# Patient Record
Sex: Female | Born: 1970 | Race: Black or African American | Hispanic: No | Marital: Single | Smoking: Never smoker
Health system: Southern US, Community
[De-identification: ages and names within clinical notes are randomized; demographics above are authoritative.]

## PROBLEM LIST (undated history)

## (undated) DIAGNOSIS — E119 Type 2 diabetes mellitus without complications: Secondary | ICD-10-CM

## (undated) DIAGNOSIS — M329 Systemic lupus erythematosus, unspecified: Secondary | ICD-10-CM

## (undated) DIAGNOSIS — M459 Ankylosing spondylitis of unspecified sites in spine: Secondary | ICD-10-CM

## (undated) DIAGNOSIS — T8859XA Other complications of anesthesia, initial encounter: Secondary | ICD-10-CM

## (undated) DIAGNOSIS — T4145XA Adverse effect of unspecified anesthetic, initial encounter: Secondary | ICD-10-CM

## (undated) DIAGNOSIS — M352 Behcet's disease: Secondary | ICD-10-CM

## (undated) DIAGNOSIS — L0231 Cutaneous abscess of buttock: Secondary | ICD-10-CM

## (undated) DIAGNOSIS — K219 Gastro-esophageal reflux disease without esophagitis: Secondary | ICD-10-CM

## (undated) DIAGNOSIS — K5090 Crohn's disease, unspecified, without complications: Secondary | ICD-10-CM

## (undated) DIAGNOSIS — L03317 Cellulitis of buttock: Secondary | ICD-10-CM

## (undated) DIAGNOSIS — I341 Nonrheumatic mitral (valve) prolapse: Secondary | ICD-10-CM

## (undated) DIAGNOSIS — K603 Anal fistula: Secondary | ICD-10-CM

## (undated) DIAGNOSIS — K529 Noninfective gastroenteritis and colitis, unspecified: Secondary | ICD-10-CM

## (undated) HISTORY — DX: Noninfective gastroenteritis and colitis, unspecified: K52.9

## (undated) HISTORY — PX: SHOULDER SURGERY: SHX246

## (undated) HISTORY — PX: CHOLECYSTECTOMY: SHX55

## (undated) HISTORY — PX: KNEE SURGERY: SHX244

## (undated) HISTORY — PX: ABDOMINAL HYSTERECTOMY: SHX81

## (undated) HISTORY — DX: Anal fistula: K60.3

## (undated) HISTORY — PX: BREAST LUMPECTOMY: SHX2

## (undated) HISTORY — DX: Gastro-esophageal reflux disease without esophagitis: K21.9

## (undated) HISTORY — PX: WRIST SURGERY: SHX841

---

## 2014-01-05 DIAGNOSIS — L0231 Cutaneous abscess of buttock: Secondary | ICD-10-CM

## 2014-01-05 DIAGNOSIS — L03317 Cellulitis of buttock: Secondary | ICD-10-CM

## 2014-01-05 HISTORY — DX: Cellulitis of buttock: L03.317

## 2014-01-05 HISTORY — DX: Cutaneous abscess of buttock: L02.31

## 2014-01-19 ENCOUNTER — Emergency Department (HOSPITAL_COMMUNITY)
Admission: EM | Admit: 2014-01-19 | Discharge: 2014-01-19 | Discharge status: Against Medical Advice | Payer: Medicare HMO | Source: Home / Self Care | Attending: Emergency Medicine | Admitting: Emergency Medicine

## 2014-01-19 ENCOUNTER — Emergency Department (HOSPITAL_COMMUNITY): Payer: Medicare HMO

## 2014-01-19 ENCOUNTER — Encounter (HOSPITAL_COMMUNITY): Payer: Self-pay | Admitting: Emergency Medicine

## 2014-01-19 DIAGNOSIS — L0390 Cellulitis, unspecified: Secondary | ICD-10-CM

## 2014-01-19 DIAGNOSIS — R000 Tachycardia, unspecified: Secondary | ICD-10-CM

## 2014-01-19 HISTORY — DX: Systemic lupus erythematosus, unspecified: M32.9

## 2014-01-19 HISTORY — DX: Behcet's disease: M35.2

## 2014-01-19 HISTORY — DX: Crohn's disease, unspecified, without complications: K50.90

## 2014-01-19 HISTORY — DX: Ankylosing spondylitis of unspecified sites in spine: M45.9

## 2014-01-19 HISTORY — DX: Type 2 diabetes mellitus without complications: E11.9

## 2014-01-19 LAB — CBC WITH DIFFERENTIAL/PLATELET
BASOS PCT: 0 % (ref 0–1)
Basophils Absolute: 0 10*3/uL (ref 0.0–0.1)
EOS ABS: 0.1 10*3/uL (ref 0.0–0.7)
Eosinophils Relative: 1 % (ref 0–5)
HCT: 37.2 % (ref 36.0–46.0)
HEMOGLOBIN: 13.1 g/dL (ref 12.0–15.0)
Lymphocytes Relative: 9 % — ABNORMAL LOW (ref 12–46)
Lymphs Abs: 1 10*3/uL (ref 0.7–4.0)
MCH: 31 pg (ref 26.0–34.0)
MCHC: 35.2 g/dL (ref 30.0–36.0)
MCV: 88.2 fL (ref 78.0–100.0)
MONOS PCT: 9 % (ref 3–12)
Monocytes Absolute: 1.1 10*3/uL — ABNORMAL HIGH (ref 0.1–1.0)
NEUTROS PCT: 81 % — AB (ref 43–77)
Neutro Abs: 9 10*3/uL — ABNORMAL HIGH (ref 1.7–7.7)
Platelets: 232 10*3/uL (ref 150–400)
RBC: 4.22 MIL/uL (ref 3.87–5.11)
RDW: 11.7 % (ref 11.5–15.5)
WBC: 11.2 10*3/uL — ABNORMAL HIGH (ref 4.0–10.5)

## 2014-01-19 LAB — CBG MONITORING, ED: Glucose-Capillary: 219 mg/dL — ABNORMAL HIGH (ref 70–99)

## 2014-01-19 LAB — URINALYSIS, ROUTINE W REFLEX MICROSCOPIC
Bilirubin Urine: NEGATIVE
GLUCOSE, UA: 500 mg/dL — AB
Ketones, ur: NEGATIVE mg/dL
LEUKOCYTES UA: NEGATIVE
Nitrite: NEGATIVE
Protein, ur: NEGATIVE mg/dL
Specific Gravity, Urine: 1.01 (ref 1.005–1.030)
Urobilinogen, UA: 0.2 mg/dL (ref 0.0–1.0)
pH: 6 (ref 5.0–8.0)

## 2014-01-19 LAB — COMPREHENSIVE METABOLIC PANEL
ALBUMIN: 3.7 g/dL (ref 3.5–5.2)
ALK PHOS: 101 U/L (ref 39–117)
ALT: 36 U/L — ABNORMAL HIGH (ref 0–35)
AST: 28 U/L (ref 0–37)
BUN: 11 mg/dL (ref 6–23)
CO2: 25 mEq/L (ref 19–32)
CREATININE: 0.68 mg/dL (ref 0.50–1.10)
Calcium: 9.5 mg/dL (ref 8.4–10.5)
Chloride: 98 mEq/L (ref 96–112)
GFR calc Af Amer: >90 mL/min (ref >90)
GFR calc non Af Amer: >90 mL/min (ref >90)
Glucose, Bld: 226 mg/dL — ABNORMAL HIGH (ref 70–99)
POTASSIUM: 4 meq/L (ref 3.7–5.3)
Sodium: 137 mEq/L (ref 137–147)
Total Bilirubin: 0.4 mg/dL (ref 0.3–1.2)
Total Protein: 8 g/dL (ref 6.0–8.3)

## 2014-01-19 LAB — URINE MICROSCOPIC-ADD ON

## 2014-01-19 LAB — BASIC METABOLIC PANEL
BUN: 7 mg/dL (ref 6–23)
CO2: 20 meq/L (ref 19–32)
CREATININE: 0.6 mg/dL (ref 0.50–1.10)
Calcium: 7.9 mg/dL — ABNORMAL LOW (ref 8.4–10.5)
Chloride: 99 mEq/L (ref 96–112)
GFR calc Af Amer: >90 mL/min (ref >90)
GFR calc non Af Amer: >90 mL/min (ref >90)
GLUCOSE: 169 mg/dL — AB (ref 70–99)
Potassium: 3.7 mEq/L (ref 3.7–5.3)
Sodium: 134 mEq/L — ABNORMAL LOW (ref 137–147)

## 2014-01-19 LAB — PREGNANCY, URINE: PREG TEST UR: NEGATIVE

## 2014-01-19 LAB — POC OCCULT BLOOD, ED: FECAL OCCULT BLD: NEGATIVE

## 2014-01-19 LAB — I-STAT CG4 LACTIC ACID, ED: Lactic Acid, Venous: 1.43 mmol/L (ref 0.5–2.2)

## 2014-01-19 MED ORDER — SODIUM CHLORIDE 0.9 % IV BOLUS (SEPSIS)
1000.0000 mL | Freq: Once | INTRAVENOUS | Status: AC
  Administered 2014-01-19: 1000 mL via INTRAVENOUS

## 2014-01-19 MED ORDER — VANCOMYCIN HCL IN DEXTROSE 1-5 GM/200ML-% IV SOLN
1000.0000 mg | Freq: Once | INTRAVENOUS | Status: AC
  Administered 2014-01-19: 1000 mg via INTRAVENOUS
  Filled 2014-01-19: qty 200

## 2014-01-19 MED ORDER — OXYCODONE-ACETAMINOPHEN 5-325 MG PO TABS
2.0000 | ORAL_TABLET | Freq: Once | ORAL | Status: AC
  Administered 2014-01-19: 2 via ORAL
  Filled 2014-01-19: qty 2

## 2014-01-19 MED ORDER — OXYCODONE-ACETAMINOPHEN 5-325 MG PO TABS: 2.0000 | ORAL_TABLET | ORAL | Status: AC | PRN

## 2014-01-19 MED ORDER — CLINDAMYCIN HCL 300 MG PO CAPS: 300.0000 mg | ORAL_CAPSULE | Freq: Three times a day (TID) | ORAL | Status: AC

## 2014-01-19 MED ORDER — LORAZEPAM 2 MG/ML IJ SOLN: 0.5000 mg | Freq: Once | INTRAMUSCULAR | Status: DC

## 2014-01-19 MED ORDER — DIPHENHYDRAMINE HCL 50 MG/ML IJ SOLN
25.0000 mg | Freq: Once | INTRAMUSCULAR | Status: AC
  Administered 2014-01-19: 25 mg via INTRAVENOUS
  Filled 2014-01-19: qty 1

## 2014-01-19 MED ORDER — IOHEXOL 300 MG/ML  SOLN
100.0000 mL | Freq: Once | INTRAMUSCULAR | Status: AC | PRN
  Administered 2014-01-19: 100 mL via INTRAVENOUS

## 2014-01-19 MED ORDER — CLINDAMYCIN PHOSPHATE 600 MG/50ML IV SOLN
600.0000 mg | Freq: Once | INTRAVENOUS | Status: AC
  Administered 2014-01-19: 600 mg via INTRAVENOUS
  Filled 2014-01-19: qty 50

## 2014-01-19 MED ORDER — KETOROLAC TROMETHAMINE 30 MG/ML IJ SOLN
30.0000 mg | Freq: Once | INTRAMUSCULAR | Status: DC
  Filled 2014-01-19: qty 1

## 2014-01-19 NOTE — ED Notes (Signed)
Pt was told that she had a fistula that needed surgery but pt had recently moved here since April this year

## 2014-01-19 NOTE — Care Management Note (Signed)
ED/CM noted patient did not have health insurance and/or PCP listed in the computer.  Patient was given the [place] resource handout with information on the clinics, food pantries, and the handout for new health insurance sign-up.  Patient expressed appreciation for information received. 

## 2014-01-19 NOTE — ED Notes (Signed)
This nurse witness rectal exam for EDP Rancour

## 2014-01-19 NOTE — ED Notes (Signed)
Pt verbalized understanding to use caution and no driving within 4 hours of taking percocet due to med causes drowsiness and that med causes constipation

## 2014-01-19 NOTE — ED Notes (Signed)
Pt reports abscess area to r buttocks x 2 weeks.  Reports area is the size of an orange.

## 2014-01-19 NOTE — ED Notes (Signed)
Pt informed tech first then RN that she began all of sudden itching and burning to scalp, IV antibiotic VAnco infusing at time and was stopped, EDP made aware and orders to stop IV Vanco

## 2014-01-19 NOTE — ED Notes (Signed)
hemoccult card negative  

## 2014-01-19 NOTE — ED Notes (Signed)
EDP spoke to pt concerning AMA, explained risks and benefits and concerns for pt's condition, pt insisted on leaving, pt signed AMA form and ambulated out

## 2014-01-19 NOTE — ED Notes (Signed)
Pt with abscess to right buttock for 2 days, pt "feels sick" when asked if N/V, unsure of fever

## 2014-01-19 NOTE — ED Provider Notes (Signed)
CSN: [id number]     Arrival date & time 01/19/14  1122 History  This chart was scribed for Glynn Octave, MD by Milly Jakob, ED Scribe. The patient was seen in room APA12/APA12. Patient's care was started at 1:50 PM.  Chief Complaint  Patient presents with  . Abscess    The history is provided by the patient. No language interpreter was used.   HPI Comments: Wendy Golden is a 43 y.o. female with a history of Crones Disease, Lupus, AS, DM, and Bacet's disease who presents to the Emergency Department complaining of a large area of redness, sharp pain, and swelling on her right buttock which has been present for 4 days. Patient states that pain is severe. Yesterday, she began noticing bloody drainage from the area. She states that she had a similar area 1 month ago that was diagnosed as a fistula. She says that she recently moved to [facility] from [facility] and was supposed to see surgeon, but she was unable to do this before relocating. She states that she has had pain while having bowel movements. She had one episode of vomiting yesterday. She denies constipation, blood in stool, chest pain, SOB, or other symptoms at this time. Patient has a surgical history of partial hysterectomy and cholecystectomy. No history of oopherectomy. Patient states she is allergic to morphine and Dilaudid.   Past Medical History  Diagnosis Date  . Lupus   . Behcet's disease   . AS (ankylosing spondylitis)   . Diabetes mellitus without complication   . Crohn's disease    Past Surgical History  Procedure Laterality Date  . Wrist surgery    . Shoulder surgery    . Breast lumpectomy    . Knee surgery    . Abdominal hysterectomy     No family history on file. History  Substance Use Topics  . Smoking status: Never Smoker   . Smokeless tobacco: Not on file  . Alcohol Use: No   OB History   Grav Para Term Preterm Abortions TAB SAB Ect Mult Living                 Review of Systems A complete 10 system review of  systems was obtained and all systems are negative except as noted in the HPI and PMH.    Allergies  Dilaudid; Keflex; Latex; Morphine and related; Other; Tape; and Toradol  Home Medications   Prior to Admission medications   Medication Sig Start Date End Date Taking? Authorizing Provider  ALPRAZolam Prudy Feeler) 1 MG tablet Take 0.5 mg by mouth daily as needed for anxiety.   Yes Historical Provider, MD  Bioflavonoid Products (VITAMIN C) CHEW Chew 1 tablet by mouth daily.   Yes Historical Provider, MD  ibuprofen (ADVIL,MOTRIN) 200 MG tablet Take 400 tablets by mouth daily as needed. 01/03/14  Yes Historical Provider, MD  Multiple Vitamins-Minerals (HAIR/SKIN/NAILS/BIOTIN) TABS Take 1 tablet by mouth daily.   Yes Historical Provider, MD  Multiple Vitamins-Minerals (MULTIVITAMINS) CHEW Chew 1 tablet by mouth daily.   Yes Historical Provider, MD  ranitidine (ZANTAC) 75 MG tablet Take 1 tablet by mouth daily. 01/03/14  Yes Historical Provider, MD  RESTASIS 0.05 % ophthalmic emulsion Place 1 drop into both eyes daily. 01/14/14  Yes Historical Provider, MD  clindamycin (CLEOCIN) 300 MG capsule Take 1 capsule (300 mg total) by mouth 3 (three) times daily. 01/19/14   Glynn Octave, MD  metFORMIN (GLUCOPHAGE) 1000 MG tablet Take 1 tablet by mouth once. 01/16/14  Historical Provider, MD  oxyCODONE-acetaminophen (PERCOCET/ROXICET) 5-325 MG per tablet Take 2 tablets by mouth every 4 (four) hours as needed for severe pain. 01/19/14   Glynn Octave, MD   Triage Vitals: BP 107/78  Pulse 86  Temp(Src) 98.5 F (36.9 C) (Oral)  Resp 12  Ht 5\' 3"  (1.6 m)  Wt 145 lb (65.772 kg)  BMI 25.69 kg/m2  SpO2 100% Physical Exam  Nursing note and vitals reviewed. Constitutional: She is oriented to person, place, and time. She appears well-developed and well-nourished. No distress.  HENT:  Head: Normocephalic and atraumatic.  Mouth/Throat: Oropharynx is clear and moist.  Eyes: Conjunctivae and EOM are normal. Pupils  are equal, round, and reactive to light.  Neck: Normal range of motion. Neck supple. No tracheal deviation present.  Cardiovascular: Normal rate, regular rhythm and normal heart sounds.   No murmur heard. Pulmonary/Chest: Effort normal and breath sounds normal. No respiratory distress. She has no wheezes. She has no rales.  Abdominal: Soft. She exhibits no distension. There is no tenderness. There is no rebound and no guarding.  Genitourinary:  Rectal exam is normal without obvious fistula. Chaperone present.  Musculoskeletal: Normal range of motion.  Neurological: She is alert and oriented to person, place, and time.  Skin: Skin is warm and dry. There is erythema.  8x8 cm area of erythema to right buttock. Tender without fluctuance or induration. Center punctum has a small area of clear drainage. Does not involve anus.   Psychiatric: She has a normal mood and affect. Her behavior is normal.    ED Course  Procedures (including critical care time) DIAGNOSTIC STUDIES: Oxygen Saturation is 100% on room, normal by my interpretation.    COORDINATION OF CARE: 2:04 PM- Performed BS ultrasound that showed cellulitis but no appreciable fluid collection. Discussed treatment plan which includes CT of abdomen, CBC with diff, CMP, UA, CBG with pt at bedside and pt agreed to plan.   EMERGENCY DEPARTMENT US SOFT TISSUE INTERPRETATION "Study: Limited Ultrasound of the noted body part in comments below"  INDICATIONS: Soft tissue infection Multiple views of the body part are obtained with a multi-frequency linear probe  PERFORMED BY:  Myself  IMAGES ARCHIVED?: Yes  SIDE:Right   BODY PART: buttock   FINDINGS: Cellulitis present  LIMITATIONS:  Emergent Procedure  INTERPRETATION:  No appreciable fluid collection, cellulitis present  COMMENT:  Will order CT of abdomen, pelvis for further evaluation   Labs Review Labs Reviewed  CBC WITH DIFFERENTIAL - Abnormal; Notable for the following:     WBC 11.2 (*)    Neutrophils Relative % 81 (*)    Neutro Abs 9.0 (*)    Lymphocytes Relative 9 (*)    Monocytes Absolute 1.1 (*)    All other components within normal limits  COMPREHENSIVE METABOLIC PANEL - Abnormal; Notable for the following:    Glucose, Bld 226 (*)    ALT 36 (*)    All other components within normal limits  URINALYSIS, ROUTINE W REFLEX MICROSCOPIC - Abnormal; Notable for the following:    Glucose, UA 500 (*)    Hgb urine dipstick TRACE (*)    All other components within normal limits  URINE MICROSCOPIC-ADD ON - Abnormal; Notable for the following:    Squamous Epithelial / LPF FEW (*)    All other components within normal limits  BASIC METABOLIC PANEL - Abnormal; Notable for the following:    Sodium 134 (*)    Glucose, Bld 169 (*)    Calcium 7.9 (*)  All other components within normal limits  CBG MONITORING, ED - Abnormal; Notable for the following:    Glucose-Capillary 219 (*)    All other components within normal limits  PREGNANCY, URINE  POC OCCULT BLOOD, ED  I-STAT CG4 LACTIC ACID, ED    Imaging Review Ct Abdomen Pelvis W Contrast  01/19/2014   CLINICAL DATA:  43 year old female with right gluteal swelling and pain.  EXAM: CT ABDOMEN AND PELVIS WITH CONTRAST  TECHNIQUE: Multidetector CT imaging of the abdomen and pelvis was performed using the standard protocol following bolus administration of intravenous contrast.  CONTRAST:  100mL OMNIPAQUE IOHEXOL 300 MG/ML  SOLN  COMPARISON:  None.  FINDINGS: The lung bases are clear.  The liver, spleen, adrenal glands, pancreas and kidneys are unremarkable.  The patient is status post cholecystectomy.  A small amount of free pelvic fluid - likely physiologic.  There is no evidence of biliary dilatation, abdominal aortic aneurysm or enlarged lymph nodes.  The bowel, bladder and appendix are unremarkable. There is no evidence of bowel obstruction, abscess or pneumoperitoneum.  Stranding/ inflammation in the right gluteal  subcutaneous fat with associated skin thickening is noted. There is no evidence focal collection/ abscess.  No acute or suspicious bony abnormalities are identified.  IMPRESSION: Right gluteal subcutaneous inflammation/ cellulitis without abscess.  Small amount of free pelvic fluid which is likely physiologic.  No other significant abnormalities noted.   Electronically Signed   By: Laveda AbbeJeff  Hu M.D.   On: 01/19/2014 16:02     EKG Interpretation   Date/Time:  Monday January 19 2014 20:28:01 EDT Ventricular Rate:  128 PR Interval:    QRS Duration: 75 QT Interval:  317 QTC Calculation: 463 R Axis:   -37 Text Interpretation:  Sinus tachycardia Left axis deviation Probable  anterior infarct, age indeterminate Confirmed by Lilianah Buffin  MD, Shakeem Stern  [phone] on 01/19/2014 8:46:06 PM      MDM   Final diagnoses:  Cellulitis  Sinus tachycardia   Four day history of redness and pain in her right buttock. Denies trauma. No fever. She is a diabetic with history of Crohn's disease. She states 2 months ago she was told she had a fistula near her rectum but [place]. This seems to have improved. Denies any blood in the stools. No abscess seen on bedside US.  CT scan shows right buttock cellulitis without abscess. No fistula. Edges of cellulitis or marked.  Patient given IV vancomycin in the ED. Approximately 50% of the way through the infusion, she developed severe itching of her scalp. Infusion was stopped. She did not have any difficulty breathing, throat tightness or chest pain. Benadryl given. Vancomycin added to allergy list.   Patient remains tachycardic in ED as high as 130. She denies any dizziness, lightheadedness, chest pain or shortness of breath. Her pain is well-controlled. She declines admission to the hospital. She believes her tachycardia is a combination of pain and anxiety.  I explained to her that her tachycardia could portend a serious infection. I recommended admission  for IV antibiotics and observation. She would rather go home and get rechecked in 48 hours.  She remains tachycardic. No CP or SOB or hypoxia to suggest PE.   I suspect d-dimer will be positive in her setting of cellulitis. Patient refuses CT scan of her chest to rule out pulmonary embolus patient understands this could be life-threatening.  I explained to her that her tachycardia may portend a more serious infection or other abnormality.  She refuses to be admitted to the hospital. She states she will return in 48 hours for a wound check.  She understands that she is leaving against medical advice as admission is recommended in setting of persistent tachycardia.  She refuses admission, she is oriented x3 and capable of making her own medical decisions. She is able to repeat back the risks of leaving AMA including heart attack, pulmonary embolism, sepsis/worsening infection, and death. She accepts these risks and has capacity to leave AMA.  I personally performed the services described in this documentation, which was scribed in my presence. The recorded information has been reviewed and is accurate.   Glynn Octave, MD 01/19/14 2322

## 2014-01-19 NOTE — Discharge Instructions (Signed)
Cellulitis You are leaving AGAINST MEDICAL ADVICE. Take antibiotics as prescribed. She should have your infection checked again in 2 days. Return to the ED if you develop worsening pani, fever, redness spreading beyond marked borders,  chest pain, shortness of breath or any other concerns. Cellulitis is an infection of the skin and the tissue beneath it. The infected area is usually red and tender. Cellulitis occurs most often in the arms and lower legs.  CAUSES  Cellulitis is caused by bacteria that enter the skin through cracks or cuts in the skin. The most common types of bacteria that cause cellulitis are Staphylococcus and Streptococcus. SYMPTOMS   Redness and warmth.  Swelling.  Tenderness or pain.  Fever. DIAGNOSIS  Your caregiver can usually determine what is wrong based on a physical exam. Blood tests may also be done. TREATMENT  Treatment usually involves taking an antibiotic medicine. HOME CARE INSTRUCTIONS   Take your antibiotics as directed. Finish them even if you start to feel better.  Keep the infected arm or leg elevated to reduce swelling.  Apply a warm cloth to the affected area up to 4 times per day to relieve pain.  Only take over-the-counter or prescription medicines for pain, discomfort, or fever as directed by your caregiver.  Keep all follow-up appointments as directed by your caregiver. SEEK MEDICAL CARE IF:   You notice red streaks coming from the infected area.  Your red area gets larger or turns dark in color.  Your bone or joint underneath the infected area becomes painful after the skin has healed.  Your infection returns in the same area or another area.  You notice a swollen bump in the infected area.  You develop new symptoms. SEEK IMMEDIATE MEDICAL CARE IF:   You have a fever.  You feel very sleepy.  You develop vomiting or diarrhea.  You have a general ill feeling (malaise) with muscle aches and pains. MAKE SURE YOU:    Understand these instructions.  Will watch your condition.  Will get help right away if you are not doing well or get worse. Document Released: 05/03/2005 Document Revised: 01/23/2012 Document Reviewed: 10/09/2011 [facility] Patient Information 2014 [place], [place].

## 2014-01-20 ENCOUNTER — Encounter (HOSPITAL_COMMUNITY): Payer: Self-pay | Admitting: Emergency Medicine

## 2014-01-20 ENCOUNTER — Inpatient Hospital Stay (HOSPITAL_COMMUNITY)
Admission: EM | Admit: 2014-01-20 | Discharge: 2014-01-22 | DRG: 872 | Disposition: A | Discharge status: Home / Self Care | Payer: Medicare HMO | Attending: Internal Medicine | Admitting: Internal Medicine

## 2014-01-20 DIAGNOSIS — M329 Systemic lupus erythematosus, unspecified: Secondary | ICD-10-CM | POA: Diagnosis present

## 2014-01-20 DIAGNOSIS — Z885 Allergy status to narcotic agent status: Secondary | ICD-10-CM

## 2014-01-20 DIAGNOSIS — A419 Sepsis, unspecified organism: Principal | ICD-10-CM | POA: Diagnosis present

## 2014-01-20 DIAGNOSIS — L03317 Cellulitis of buttock: Secondary | ICD-10-CM | POA: Diagnosis present

## 2014-01-20 DIAGNOSIS — R6510 Systemic inflammatory response syndrome (SIRS) of non-infectious origin without acute organ dysfunction: Secondary | ICD-10-CM

## 2014-01-20 DIAGNOSIS — M459 Ankylosing spondylitis of unspecified sites in spine: Secondary | ICD-10-CM | POA: Diagnosis present

## 2014-01-20 DIAGNOSIS — K279 Peptic ulcer, site unspecified, unspecified as acute or chronic, without hemorrhage or perforation: Secondary | ICD-10-CM | POA: Diagnosis present

## 2014-01-20 DIAGNOSIS — K219 Gastro-esophageal reflux disease without esophagitis: Secondary | ICD-10-CM | POA: Diagnosis present

## 2014-01-20 DIAGNOSIS — K5090 Crohn's disease, unspecified, without complications: Secondary | ICD-10-CM | POA: Diagnosis present

## 2014-01-20 DIAGNOSIS — L0231 Cutaneous abscess of buttock: Secondary | ICD-10-CM | POA: Diagnosis present

## 2014-01-20 DIAGNOSIS — E119 Type 2 diabetes mellitus without complications: Secondary | ICD-10-CM | POA: Diagnosis present

## 2014-01-20 DIAGNOSIS — Z888 Allergy status to other drugs, medicaments and biological substances status: Secondary | ICD-10-CM | POA: Diagnosis not present

## 2014-01-20 DIAGNOSIS — L0291 Cutaneous abscess, unspecified: Secondary | ICD-10-CM | POA: Diagnosis present

## 2014-01-20 DIAGNOSIS — M352 Behcet's disease: Secondary | ICD-10-CM | POA: Diagnosis present

## 2014-01-20 DIAGNOSIS — R739 Hyperglycemia, unspecified: Secondary | ICD-10-CM | POA: Diagnosis present

## 2014-01-20 DIAGNOSIS — Z881 Allergy status to other antibiotic agents status: Secondary | ICD-10-CM

## 2014-01-20 DIAGNOSIS — F411 Generalized anxiety disorder: Secondary | ICD-10-CM | POA: Diagnosis present

## 2014-01-20 HISTORY — DX: Cellulitis of buttock: L03.317

## 2014-01-20 HISTORY — DX: Other complications of anesthesia, initial encounter: T88.59XA

## 2014-01-20 HISTORY — DX: Cutaneous abscess of buttock: L02.31

## 2014-01-20 HISTORY — DX: Nonrheumatic mitral (valve) prolapse: I34.1

## 2014-01-20 HISTORY — DX: Adverse effect of unspecified anesthetic, initial encounter: T41.45XA

## 2014-01-20 LAB — CBC
HCT: 33.7 % — ABNORMAL LOW (ref 36.0–46.0)
Hemoglobin: 11.5 g/dL — ABNORMAL LOW (ref 12.0–15.0)
MCH: 30.1 pg (ref 26.0–34.0)
MCHC: 34.1 g/dL (ref 30.0–36.0)
MCV: 88.2 fL (ref 78.0–100.0)
Platelets: 220 10*3/uL (ref 150–400)
RBC: 3.82 MIL/uL — ABNORMAL LOW (ref 3.87–5.11)
RDW: 11.8 % (ref 11.5–15.5)
WBC: 15.4 10*3/uL — ABNORMAL HIGH (ref 4.0–10.5)

## 2014-01-20 LAB — BASIC METABOLIC PANEL
BUN: 5 mg/dL — AB (ref 6–23)
CO2: 22 meq/L (ref 19–32)
CREATININE: 0.69 mg/dL (ref 0.50–1.10)
Calcium: 8.7 mg/dL (ref 8.4–10.5)
Chloride: 97 mEq/L (ref 96–112)
GFR calc Af Amer: >90 mL/min (ref >90)
GLUCOSE: 271 mg/dL — AB (ref 70–99)
Potassium: 3.9 mEq/L (ref 3.7–5.3)
SODIUM: 134 meq/L — AB (ref 137–147)

## 2014-01-20 LAB — URINALYSIS, ROUTINE W REFLEX MICROSCOPIC
Bilirubin Urine: NEGATIVE
Glucose, UA: >1000 mg/dL — AB
Ketones, ur: 40 mg/dL — AB
Leukocytes, UA: NEGATIVE
Nitrite: NEGATIVE
Protein, ur: NEGATIVE mg/dL
Specific Gravity, Urine: 1.018 (ref 1.005–1.030)
Urobilinogen, UA: 1 mg/dL (ref 0.0–1.0)
pH: 6 (ref 5.0–8.0)

## 2014-01-20 LAB — URINE MICROSCOPIC-ADD ON

## 2014-01-20 LAB — GLUCOSE, CAPILLARY: Glucose-Capillary: 253 mg/dL — ABNORMAL HIGH (ref 70–99)

## 2014-01-20 MED ORDER — OXYCODONE-ACETAMINOPHEN 5-325 MG PO TABS: 2.0000 | ORAL_TABLET | ORAL | Status: DC | PRN

## 2014-01-20 MED ORDER — SODIUM CHLORIDE 0.9 % IJ SOLN: 3.0000 mL | Freq: Two times a day (BID) | INTRAMUSCULAR | Status: DC

## 2014-01-20 MED ORDER — OXYCODONE-ACETAMINOPHEN 5-325 MG PO TABS
1.0000 | ORAL_TABLET | Freq: Once | ORAL | Status: AC
  Administered 2014-01-20: 1 via ORAL
  Filled 2014-01-20: qty 1

## 2014-01-20 MED ORDER — CYCLOSPORINE 0.05 % OP EMUL
1.0000 [drp] | Freq: Every day | OPHTHALMIC | Status: DC
  Administered 2014-01-21 – 2014-01-22 (×2): 1 [drp] via OPHTHALMIC
  Filled 2014-01-20 (×2): qty 1

## 2014-01-20 MED ORDER — ALPRAZOLAM 0.5 MG PO TABS: 0.5000 mg | ORAL_TABLET | Freq: Every day | ORAL | Status: DC | PRN

## 2014-01-20 MED ORDER — ACETAMINOPHEN 325 MG PO TABS: 325.0000 mg | ORAL_TABLET | Freq: Two times a day (BID) | ORAL | Status: DC | PRN

## 2014-01-20 MED ORDER — PANTOPRAZOLE SODIUM 40 MG PO TBEC
40.0000 mg | DELAYED_RELEASE_TABLET | Freq: Every day | ORAL | Status: DC
Start: 2014-01-20 — End: 2014-01-22
  Administered 2014-01-21 – 2014-01-22 (×3): 40 mg via ORAL
  Filled 2014-01-20 (×3): qty 1

## 2014-01-20 MED ORDER — INSULIN ASPART 100 UNIT/ML ~~LOC~~ SOLN
0.0000 [IU] | Freq: Three times a day (TID) | SUBCUTANEOUS | Status: DC
  Administered 2014-01-21 (×2): 2 [IU] via SUBCUTANEOUS
  Administered 2014-01-22 (×2): 3 [IU] via SUBCUTANEOUS

## 2014-01-20 MED ORDER — ENOXAPARIN SODIUM 40 MG/0.4ML ~~LOC~~ SOLN
40.0000 mg | SUBCUTANEOUS | Status: DC
  Filled 2014-01-20 (×3): qty 0.4

## 2014-01-20 MED ORDER — FENTANYL CITRATE 0.05 MG/ML IJ SOLN
50.0000 ug | Freq: Once | INTRAMUSCULAR | Status: DC
  Filled 2014-01-20: qty 2

## 2014-01-20 MED ORDER — ACETAMINOPHEN 500 MG PO TABS: 500.0000 mg | ORAL_TABLET | ORAL | Status: DC | PRN

## 2014-01-20 MED ORDER — CLINDAMYCIN PHOSPHATE 600 MG/50ML IV SOLN
600.0000 mg | Freq: Three times a day (TID) | INTRAVENOUS | Status: DC
  Administered 2014-01-21 – 2014-01-22 (×4): 600 mg via INTRAVENOUS
  Filled 2014-01-20 (×6): qty 50

## 2014-01-20 MED ORDER — IBUPROFEN 400 MG PO TABS: 400.0000 mg | ORAL_TABLET | Freq: Every day | ORAL | Status: DC | PRN

## 2014-01-20 MED ORDER — OXYCODONE-ACETAMINOPHEN 5-325 MG PO TABS
1.0000 | ORAL_TABLET | ORAL | Status: DC | PRN
  Administered 2014-01-21: 1 via ORAL
  Administered 2014-01-21: 2 via ORAL
  Filled 2014-01-20: qty 1
  Filled 2014-01-20: qty 2

## 2014-01-20 MED ORDER — SODIUM CHLORIDE 0.9 % IJ SOLN: 3.0000 mL | INTRAMUSCULAR | Status: DC | PRN

## 2014-01-20 MED ORDER — CLINDAMYCIN PHOSPHATE 600 MG/50ML IV SOLN
600.0000 mg | Freq: Once | INTRAVENOUS | Status: AC
  Administered 2014-01-20: 600 mg via INTRAVENOUS
  Filled 2014-01-20: qty 50

## 2014-01-20 MED ORDER — SODIUM CHLORIDE 0.9 % IV SOLN
INTRAVENOUS | Status: AC
  Administered 2014-01-20 – 2014-01-21 (×2): via INTRAVENOUS

## 2014-01-20 MED ORDER — INSULIN ASPART 100 UNIT/ML ~~LOC~~ SOLN
0.0000 [IU] | Freq: Every day | SUBCUTANEOUS | Status: DC
Start: 2014-01-20 — End: 2014-01-22
  Administered 2014-01-20: 3 [IU] via SUBCUTANEOUS

## 2014-01-20 MED ORDER — INSULIN GLARGINE 100 UNIT/ML ~~LOC~~ SOLN
10.0000 [IU] | Freq: Every day | SUBCUTANEOUS | Status: DC
  Filled 2014-01-20: qty 0.1

## 2014-01-20 NOTE — H&P (Signed)
Date: 01/20/2014               Patient Name:  Wendy Golden MRN: [id number]  DOB: 12-10-1970 Age / Sex: 43 y.o., female   PCP: No Pcp Per Patient         Medical Service: Internal Medicine Teaching Service         Attending Physician: Dr. Inez Catalina, MD    First Contact: Dr. Lars Masson Pager: [phone]  Second Contact: Dr. Dow Adolph Pager: [phone]       After Hours (After 5p/  First Contact Pager: [phone]  weekends / holidays): Second Contact Pager: [phone]   Chief Complaint: left buttock swelling  History of Present Illness: Wendy Golden is a 43 year old woman with a PMH of Crohn's disease with prior fistula, SLE, Behcet's, ankylosing spondylitis and DM type 2 who presents for left buttock redness, swelling and pain x 5 days.  She reports waking up last Friday morning with an itchy, red pot on her right buttock.  She notes having had a similar area on her thigh a few weeks ago that looked like a bug bite and resolved on its own.  This time she scratched it and it began to drain a bloody discharge.  The area began to enlarge and she noted it was warm, hard to the touch and continued to drain serosanguinous fluid.  Associated symptoms include nausea, chills and abdominal discomfort.  She denies vomiting, change in bowel or bladder habits from baseline, chest pain, dyspnea or decreased po.  She tried ibuprofen and warm compresses to relieve the discomfort.  The site was not improving so she went to [facility] ED yesterday and was diagnosed with cellulitis.  She was initially treated with Vancomycin in the [facility] ED, which she developed an allergic reaction to (itchy rash).  She was then given Clindamycin, was noted to be tachycardic and admission was recommended.  However, she refused admission and left against medical advice.  She was given po Clindamycin, which she did not fill, to continue as an outpatient.  The erythematous area was demarcated with a marker and she was instructed to seek  medical attention if the area reached the line or beyond.  She presents today because the area of redness is has progressed beyond the demarcation this AM.    In the ED:  T 98.69F, RR 20, SpO2 100%, HR 107, BP 106/11mmHg; she received IV clindamycin and Percocet.  Meds: Current Facility-Administered Medications  Medication Dose Route Frequency Provider Last Rate Last Dose  . clindamycin (CLEOCIN) IVPB 600 mg  600 mg Intravenous Once [employer]. Pickering, MD 100 mL/hr at 01/20/14 1910 600 mg at 01/20/14 1910   Current Outpatient Prescriptions  Medication Sig Dispense Refill  . ALPRAZolam (XANAX) 1 MG tablet Take 0.5 mg by mouth daily as needed for anxiety.      Marland Kitchen Bioflavonoid Products (VITAMIN C) CHEW Chew 1 tablet by mouth daily.      Marland Kitchen ibuprofen (ADVIL,MOTRIN) 200 MG tablet Take 400 tablets by mouth daily as needed.      . metFORMIN (GLUCOPHAGE) 1000 MG tablet Take 1 tablet by mouth once.      . Multiple Vitamins-Minerals (HAIR/SKIN/NAILS/BIOTIN) TABS Take 1 tablet by mouth daily.      . Multiple Vitamins-Minerals (MULTIVITAMINS) CHEW Chew 1 tablet by mouth daily.      Marland Kitchen oxyCODONE-acetaminophen (PERCOCET/ROXICET) 5-325 MG per tablet Take 2 tablets by mouth every 4 (four) hours as needed for severe  pain.  15 tablet  0  . ranitidine (ZANTAC) 75 MG tablet Take 1 tablet by mouth daily.      . RESTASIS 0.05 % ophthalmic emulsion Place 1 drop into both eyes daily.      . clindamycin (CLEOCIN) 300 MG capsule Take 1 capsule (300 mg total) by mouth 3 (three) times daily.  30 capsule  0    Allergies: Allergies as of 01/20/2014 - Review Complete 01/20/2014  Allergen Reaction Noted  . Keflex [cephalexin] Shortness Of Breath 01/19/2014  . Other Shortness Of Breath 01/19/2014  . Dilaudid [hydromorphone hcl]  01/19/2014  . Latex Itching 01/19/2014  . Morphine and related  01/19/2014  . Tape  01/19/2014  . Toradol [ketorolac tromethamine]  01/19/2014  . Vancomycin Itching 01/20/2014   Past Medical  History  Diagnosis Date  . Lupus   . Behcet's disease   . AS (ankylosing spondylitis)   . Diabetes mellitus without complication   . Crohn's disease    Past Surgical History  Procedure Laterality Date  . Wrist surgery    . Shoulder surgery    . Breast lumpectomy    . Knee surgery    . Abdominal hysterectomy     History reviewed. No pertinent family history. History   Social History  . Marital Status: Single    Spouse Name: N/A    Number of Children: N/A  . Years of Education: N/A   Occupational History  . Not on file.   Social History Main Topics  . Smoking status: Never Smoker   . Smokeless tobacco: Not on file  . Alcohol Use: No  . Drug Use: No  . Sexual Activity: Not on file   Other Topics Concern  . Not on file   Social History Narrative  . No narrative on file    Review of Systems: Pertinent items are noted in HPI.  Physical Exam: Blood pressure 106/64, pulse 106, temperature 100.2 F (37.9 C), temperature source Oral, resp. rate 12, height 5\' 3"  (1.6 m), weight 68.085 kg (150 lb 1.6 oz), SpO2 100.00%. General: resting in bed in NAD HEENT: PERRL, EOMI, oropharynx clear Cardiac: RRR, no rubs, murmurs or gallops Pulm: clear to auscultation bilaterally, moving normal volumes of air Abd: soft, nondistended, BS present, + epigastric tenderness Ext: warm and well perfused, no pedal edema, SCDs in place, +2 DPs  Skin:  Right buttock with ~ 12cm x 14cm round indurated, erythematous, tender area with central opening oozing serosanguinous drainage; the area has extended beyond circle drawn 1 day prior; no fluctuance noted; left toenail surgically absent, no wounds on either foot Neuro: alert and oriented X3, cranial nerves II-XII grossly intact, strength and sensation equal and intact B/L upper and lower extremities, able to move all extremities voluntarily  Lab results: Basic Metabolic Panel:  Recent Labs  40/98/11 2017 01/20/14 1315  NA 134* 134*  K 3.7  3.9  CL 99 97  CO2 20 22  GLUCOSE 169* 271*  BUN 7 5*  CREATININE 0.60 0.69  CALCIUM 7.9* 8.7   Liver Function Tests:  Recent Labs  01/19/14 1402  AST 28  ALT 36*  ALKPHOS 101  BILITOT 0.4  PROT 8.0  ALBUMIN 3.7   CBC:  Recent Labs  01/19/14 1402 01/20/14 1315  WBC 11.2* 15.4*  NEUTROABS 9.0*  --   HGB 13.1 11.5*  HCT 37.2 33.7*  MCV 88.2 88.2  PLT 232 220   CBG:  Recent Labs  01/19/14 1333  GLUCAP 219*  Urinalysis:  Recent Labs  01/19/14 1400  COLORURINE YELLOW  LABSPEC 1.010  PHURINE 6.0  GLUCOSEU 500*  HGBUR TRACE*  BILIRUBINUR NEGATIVE  KETONESUR NEGATIVE  PROTEINUR NEGATIVE  UROBILINOGEN 0.2  NITRITE NEGATIVE  LEUKOCYTESUR NEGATIVE   Imaging results:  Ct Abdomen Pelvis W Contrast  01/19/2014   CLINICAL DATA:  43 year old female with right gluteal swelling and pain.  EXAM: CT ABDOMEN AND PELVIS WITH CONTRAST  TECHNIQUE: Multidetector CT imaging of the abdomen and pelvis was performed using the standard protocol following bolus administration of intravenous contrast.  CONTRAST:  OMNIPAQUE IOHEXOL 300 MG/ML  SOLN  COMPARISON:  None.  FINDINGS: The lung bases are clear.  The liver, spleen, adrenal glands, pancreas and kidneys are unremarkable.  The patient is status post cholecystectomy.  A small amount of free pelvic fluid - likely physiologic.  There is no evidence of biliary dilatation, abdominal aortic aneurysm or enlarged lymph nodes.  The bowel, bladder and appendix are unremarkable. There is no evidence of bowel obstruction, abscess or pneumoperitoneum.  Stranding/ inflammation in the right gluteal subcutaneous fat with associated skin thickening is noted. There is no evidence focal collection/ abscess.  No acute or suspicious bony abnormalities are identified.  IMPRESSION: Right gluteal subcutaneous inflammation/ cellulitis without abscess.  Small amount of free pelvic fluid which is likely physiologic.  No other significant abnormalities  noted.   Electronically Signed   By: Laveda Abbe M.D.   On: 01/19/2014 16:02    Other results: EKG: pending 06/15 EKG from [facility] reviewed:  HR 128 bpm, sinus tachycardia, ?LAD, normal intervals, no ST abnormalities  Assessment & Plan by Problem: 43 year old woman with a PMH of Crohn's disease, SLE, Behcet's, ankylosing spondylitis and DM type 2 who presents for left buttock redness, swelling and pain x 5 days.  Sepsis 2/2 to right buttock cellulitis:  + tachycardia and leukocytosis with cellulitis the probable source.  Erythematous area on right buttock warm and indurated but no evidence of abscess on 06/15 CT at [facility]   Her leukocytosis of 15.4 has increased from 11.2 at [facility] yesterday.  Patient currently afebrile.  Lactic acid normal.   - admit to IMTS on telemetry  - continue clindamycin IV - NSS at 100cc/hr x 12hrs - prn warm compresses to right buttock - Percocet prn for pain - consider surgical consult in AM  - f/u blood cultures and wound culture - monitor CBC and for development of fever - control blood glucose as below  Tachycardia:  Likely 2/2 to sepsis and pain.  She denies chest pain or dyspnea and no hypoxia making PE unlikely (Wells Score 1.5, low risk for PE). - monitor on telemetry - treating cellulitis as above - repeat EKG  Epigastric pain:  Reports being told she had PUD.  She takes Zantac at home.  Has recently been taking OTC ibuprofen for buttock pain/swelling and this is when the pain began. - hold ibuprofen - Percocet prn for pain - Protonix  DM type 2:  Diagnosed in 1999.  She reports having been on Glucophage since diagnoses up until three years ago when her insurance stopped covering the name brand.  She has since been on generic metformin but she says she cannot tolerate the GI ADRs (which she feels are worse with generic) so she rarely takes this medication (she will take it if her BG is in the high 200s).  She says HgbA1c was 7.8 two months ago.  BG  271 today.  She last took Metformin last week.  Small AG of 15 and ketonuria, however BG relatively low (mid-200s) for this to be DKA.  She will need tight control of her BG to ensure healing of wound.   - check HgbA1c - SSI moderate - Lantus 10 units qHS  - CBG monitoring ACHS - diabetes education   Crohn's disease, ankylosing spondylitis, SLE and Behcet's:  She says she was advised to start Humira (specifically for Crohn's) but declined due to fears of ADRs.  [place] and has not established care with a local PCP, Rheumatologist or Gastroenterologist.  She is not on any medication for these conditions at present.  She reports having been diagnosed with a fistula about 1 month ago in [place].  No change in bowel or bladder or CT evidence of fistula.  She was at baseline prior to development of current cellulitis. - she will need referral to PCP and/or specialist(s) prior to discharge  Diet:  Carb mod VTE ppx:  lovenox (patient refusing lovenox so encouraged her to move her legs as much as tolerate) and SCDs Code:  Full  Dispo: Disposition is deferred at this time, awaiting improvement of current medical problems. Anticipated discharge in approximately 1-2 day(s).   The patient does not have a current PCP (No Pcp Per Patient) and does not know need an [facility] follow[facility] appointment after discharge.  The patient does not know have transportation limitations that hinder transportation to clinic appointments.  Signed: Evelena PeatAlex Aislyn Hayse, DO 01/20/2014, 7:32 PM

## 2014-01-20 NOTE — ED Provider Notes (Signed)
CSN: [id number]     Arrival date & time 01/20/14  1229 History   First MD Initiated Contact with Patient 01/20/14 1616     Chief Complaint  Patient presents with  . Skin Problem     (Consider location/radiation/quality/duration/timing/severity/associated sxs/prior Treatment) The history is provided by the patient.   patient has had a tender swollen area to her left buttock for the last 5 days. She was seen in the [facility] hospital yesterday and diagnosed with cellulitis after CT scan. She was persistently tachycardic at that time. The left AMA. She was started on clindamycin after a itching/redness/flushing reaction on her scalp to vancomycin. She states she's continued to have fevers and chills. She states the swelling has gotten larger. She states that a line was drawn around the swelling yesterday around an inch larger than the swelling she states this morning it had moved up to the line and have since the past. He states his been some drainage. She states it is painful. She has a history of Crohn's disease and states she has had fistulas in the past.  Past Medical History  Diagnosis Date  . Lupus   . Behcet's disease   . AS (ankylosing spondylitis)   . Diabetes mellitus without complication   . Crohn's disease    Past Surgical History  Procedure Laterality Date  . Wrist surgery    . Shoulder surgery    . Breast lumpectomy    . Knee surgery    . Abdominal hysterectomy     History reviewed. No pertinent family history. History  Substance Use Topics  . Smoking status: Never Smoker   . Smokeless tobacco: Not on file  . Alcohol Use: No   OB History   Grav Para Term Preterm Abortions TAB SAB Ect Mult Living                 Review of Systems  Constitutional: Positive for fever and appetite change. Negative for activity change.  Eyes: Negative for pain.  Respiratory: Negative for chest tightness and shortness of breath.   Cardiovascular: Negative for chest pain and leg swelling.   Gastrointestinal: Positive for nausea and constipation. Negative for vomiting, abdominal pain and diarrhea.  Genitourinary: Negative for flank pain.  Musculoskeletal: Negative for back pain and neck stiffness.  Skin: Positive for color change and wound. Negative for rash.  Neurological: Negative for weakness, numbness and headaches.  Psychiatric/Behavioral: Negative for behavioral problems.      Allergies  Keflex; Other; Dilaudid; Latex; Morphine and related; Tape; Toradol; and Vancomycin  Home Medications   Prior to Admission medications   Medication Sig Start Date End Date Taking? Authorizing Provider  ALPRAZolam Prudy Feeler) 1 MG tablet Take 0.5 mg by mouth daily as needed for anxiety.   Yes Historical Provider, MD  Bioflavonoid Products (VITAMIN C) CHEW Chew 1 tablet by mouth daily.   Yes Historical Provider, MD  ibuprofen (ADVIL,MOTRIN) 200 MG tablet Take 400 tablets by mouth daily as needed. 01/03/14  Yes Historical Provider, MD  metFORMIN (GLUCOPHAGE) 1000 MG tablet Take 1 tablet by mouth once. 01/16/14  Yes Historical Provider, MD  Multiple Vitamins-Minerals (HAIR/SKIN/NAILS/BIOTIN) TABS Take 1 tablet by mouth daily.   Yes Historical Provider, MD  Multiple Vitamins-Minerals (MULTIVITAMINS) CHEW Chew 1 tablet by mouth daily.   Yes Historical Provider, MD  oxyCODONE-acetaminophen (PERCOCET/ROXICET) 5-325 MG per tablet Take 2 tablets by mouth every 4 (four) hours as needed for severe pain. 01/19/14  Yes Glynn Octave, MD  ranitidine (ZANTAC) 75  MG tablet Take 1 tablet by mouth daily. 01/03/14  Yes Historical Provider, MD  RESTASIS 0.05 % ophthalmic emulsion Place 1 drop into both eyes daily. 01/14/14  Yes Historical Provider, MD  clindamycin (CLEOCIN) 300 MG capsule Take 1 capsule (300 mg total) by mouth 3 (three) times daily. 01/19/14   Glynn Octave, MD   BP 115/70  Pulse 107  Temp(Src) 100.2 F (37.9 C) (Oral)  Resp 17  Ht 5\' 3"  (1.6 m)  Wt 150 lb 1.6 oz (68.085 kg)  BMI 26.60  kg/m2  SpO2 100% Physical Exam  Nursing note and vitals reviewed. Constitutional: She is oriented to person, place, and time. She appears well-developed and well-nourished.  HENT:  Head: Normocephalic and atraumatic.  Eyes: EOM are normal. Pupils are equal, round, and reactive to light.  Neck: Normal range of motion. Neck supple.  Cardiovascular: Normal rate, regular rhythm and normal heart sounds.   No murmur heard. Pulmonary/Chest: Effort normal and breath sounds normal. No respiratory distress. She has no wheezes. She has no rales.  Abdominal: Soft. Bowel sounds are normal. She exhibits no distension. There is no tenderness. There is no rebound and no guarding.  Musculoskeletal: Normal range of motion.  Neurological: She is alert and oriented to person, place, and time. No cranial nerve deficit.  Skin: Skin is warm and dry. There is erythema.  Large tender indurated area with loss of top layer of skin centrally. There is a small amount of purulent drainage but no fluctuance. It is expanded beyond a circle demarcated on the periphery. It is tender 12 cm in diameter.  Psychiatric: She has a normal mood and affect. Her speech is normal.    ED Course  Procedures (including critical care time) Labs Review Labs Reviewed  CBC - Abnormal; Notable for the following:    WBC 15.4 (*)    RBC 3.82 (*)    Hemoglobin 11.5 (*)    HCT 33.7 (*)    All other components within normal limits  BASIC METABOLIC PANEL - Abnormal; Notable for the following:    Sodium 134 (*)    Glucose, Bld 271 (*)    BUN 5 (*)    All other components within normal limits  WOUND CULTURE  CULTURE, BLOOD (ROUTINE X 2)  CULTURE, BLOOD (ROUTINE X 2)    Imaging Review Ct Abdomen Pelvis W Contrast  01/19/2014   CLINICAL DATA:  43 year old female with right gluteal swelling and pain.  EXAM: CT ABDOMEN AND PELVIS WITH CONTRAST  TECHNIQUE: Multidetector CT imaging of the abdomen and pelvis was performed using the standard  protocol following bolus administration of intravenous contrast.  CONTRAST:  OMNIPAQUE IOHEXOL 300 MG/ML  SOLN  COMPARISON:  None.  FINDINGS: The lung bases are clear.  The liver, spleen, adrenal glands, pancreas and kidneys are unremarkable.  The patient is status post cholecystectomy.  A small amount of free pelvic fluid - likely physiologic.  There is no evidence of biliary dilatation, abdominal aortic aneurysm or enlarged lymph nodes.  The bowel, bladder and appendix are unremarkable. There is no evidence of bowel obstruction, abscess or pneumoperitoneum.  Stranding/ inflammation in the right gluteal subcutaneous fat with associated skin thickening is noted. There is no evidence focal collection/ abscess.  No acute or suspicious bony abnormalities are identified.  IMPRESSION: Right gluteal subcutaneous inflammation/ cellulitis without abscess.  Small amount of free pelvic fluid which is likely physiologic.  No other significant abnormalities noted.   Electronically Signed   By: Trey Paula  Hu M.D.   On: 01/19/2014 16:02     EKG Interpretation None      MDM   Final diagnoses:  Cellulitis of buttock    Patient with cellulitis on right buttock. Had been treated with IV antibiotics yesterday. It is continued to swell. Culture had been sent. Under a cluster admitting doctor blood cultures were sent 2. Will admit to internal medicine.    Juliet RudeNathan R. Rubin PayorPickering, MD 01/20/14 [id number]

## 2014-01-20 NOTE — ED Notes (Signed)
She has a sore red spot on R buttock since Friday. She was at [facility] yesterday for the same and was given 2 antibiotics but states the sore is bigger today.

## 2014-01-20 NOTE — ED Notes (Addendum)
Pt placed in gown. Claudia NT in room. Pt refused a wheelchair on transport back to D36

## 2014-01-21 ENCOUNTER — Encounter (HOSPITAL_COMMUNITY): Payer: Self-pay | Admitting: General Practice

## 2014-01-21 DIAGNOSIS — M459 Ankylosing spondylitis of unspecified sites in spine: Secondary | ICD-10-CM | POA: Diagnosis present

## 2014-01-21 DIAGNOSIS — M352 Behcet's disease: Secondary | ICD-10-CM

## 2014-01-21 DIAGNOSIS — L03317 Cellulitis of buttock: Secondary | ICD-10-CM

## 2014-01-21 DIAGNOSIS — K5289 Other specified noninfective gastroenteritis and colitis: Secondary | ICD-10-CM

## 2014-01-21 DIAGNOSIS — R000 Tachycardia, unspecified: Secondary | ICD-10-CM

## 2014-01-21 DIAGNOSIS — E119 Type 2 diabetes mellitus without complications: Secondary | ICD-10-CM

## 2014-01-21 DIAGNOSIS — L0291 Cutaneous abscess, unspecified: Secondary | ICD-10-CM | POA: Diagnosis present

## 2014-01-21 DIAGNOSIS — K5090 Crohn's disease, unspecified, without complications: Secondary | ICD-10-CM | POA: Diagnosis present

## 2014-01-21 DIAGNOSIS — L0231 Cutaneous abscess of buttock: Secondary | ICD-10-CM

## 2014-01-21 DIAGNOSIS — M329 Systemic lupus erythematosus, unspecified: Secondary | ICD-10-CM

## 2014-01-21 DIAGNOSIS — A419 Sepsis, unspecified organism: Secondary | ICD-10-CM

## 2014-01-21 DIAGNOSIS — R1013 Epigastric pain: Secondary | ICD-10-CM

## 2014-01-21 LAB — GLUCOSE, CAPILLARY
GLUCOSE-CAPILLARY: 138 mg/dL — AB (ref 70–99)
GLUCOSE-CAPILLARY: 119 mg/dL — AB (ref 70–99)
GLUCOSE-CAPILLARY: 132 mg/dL — AB (ref 70–99)
GLUCOSE-CAPILLARY: 146 mg/dL — AB (ref 70–99)

## 2014-01-21 LAB — CBC WITH DIFFERENTIAL/PLATELET
Basophils Absolute: 0 10*3/uL (ref 0.0–0.1)
Basophils Relative: 0 % (ref 0–1)
Eosinophils Absolute: 0.1 10*3/uL (ref 0.0–0.7)
Eosinophils Relative: 1 % (ref 0–5)
HEMATOCRIT: 32.9 % — AB (ref 36.0–46.0)
HEMOGLOBIN: 11.3 g/dL — AB (ref 12.0–15.0)
LYMPHS PCT: 8 % — AB (ref 12–46)
Lymphs Abs: 0.9 10*3/uL (ref 0.7–4.0)
MCH: 30.4 pg (ref 26.0–34.0)
MCHC: 34.3 g/dL (ref 30.0–36.0)
MCV: 88.4 fL (ref 78.0–100.0)
MONO ABS: 0.9 10*3/uL (ref 0.1–1.0)
MONOS PCT: 8 % (ref 3–12)
Neutro Abs: 8.7 10*3/uL — ABNORMAL HIGH (ref 1.7–7.7)
Neutrophils Relative %: 83 % — ABNORMAL HIGH (ref 43–77)
Platelets: 232 10*3/uL (ref 150–400)
RBC: 3.72 MIL/uL — AB (ref 3.87–5.11)
RDW: 11.9 % (ref 11.5–15.5)
WBC: 10.6 10*3/uL — AB (ref 4.0–10.5)

## 2014-01-21 LAB — BASIC METABOLIC PANEL
BUN: 5 mg/dL — ABNORMAL LOW (ref 6–23)
CO2: 22 meq/L (ref 19–32)
Calcium: 8.8 mg/dL (ref 8.4–10.5)
Chloride: 102 mEq/L (ref 96–112)
Creatinine, Ser: 0.6 mg/dL (ref 0.50–1.10)
GFR calc Af Amer: >90 mL/min (ref >90)
GFR calc non Af Amer: >90 mL/min (ref >90)
GLUCOSE: 138 mg/dL — AB (ref 70–99)
POTASSIUM: 3.7 meq/L (ref 3.7–5.3)
Sodium: 139 mEq/L (ref 137–147)

## 2014-01-21 LAB — MAGNESIUM: Magnesium: 2.1 mg/dL (ref 1.5–2.5)

## 2014-01-21 MED ORDER — SENNOSIDES-DOCUSATE SODIUM 8.6-50 MG PO TABS
1.0000 | ORAL_TABLET | Freq: Two times a day (BID) | ORAL | Status: DC
  Administered 2014-01-21 – 2014-01-22 (×2): 1 via ORAL
  Filled 2014-01-21 (×2): qty 1

## 2014-01-21 MED ORDER — ONDANSETRON HCL 4 MG PO TABS
4.0000 mg | ORAL_TABLET | Freq: Three times a day (TID) | ORAL | Status: DC | PRN
  Administered 2014-01-21: 4 mg via ORAL
  Filled 2014-01-21: qty 1

## 2014-01-21 MED ORDER — HYDROCODONE-ACETAMINOPHEN 5-325 MG PO TABS: 1.0000 | ORAL_TABLET | Freq: Four times a day (QID) | ORAL | Status: DC | PRN

## 2014-01-21 MED ORDER — SODIUM CHLORIDE 0.9 % IV SOLN: INTRAVENOUS | Status: DC

## 2014-01-21 NOTE — Progress Notes (Signed)
Paged by RN b/c patient was worried her cellulitis was worsening and her legs were swelling and turing "purple". On exam, right buttock cellulitis appeared improved since AM rounds, smaller than previously demarcated area. Still very tender to palpation and indurated. No fluctuance on exam, no drainage, or tracking noted. Also of note, leukocytosis appears to be resolving, WBC's 10.6 this AM. Legs appeared normal in color, no significant edema. Pulmonary exam w/out crackles. Patient did not appear SOB, continues to appear well. Patient also noted to have continued BP of 92/60. After discussing w/ the patient, she claims her SBP is rarely over 100 and claims that it usually runs in the 80's. Patient denies any significant dizziness or lightheadedness, no significant tachycardia. Patient continues to be afebrile. Lastly, patient says she has not had a bowel movement in over 24 hours, which is abnormal for her.  -Will stop IVF for now as patient is tolerating po quite well. -Senokot-S bid for 4 doses -Re-evaluate area of cellulitis in AM; if not improved or suspect abscess, can proceed w/ bedside ultrasound and possible I&D.   Signed: Lars MassonJones, Okie Jansson, MD 01/21/2014 4:39 PM

## 2014-01-21 NOTE — Progress Notes (Signed)
Subjective: Patient seen at bedside this AM. Still complains of pain in the right buttock and mild nausea. Denies fever, chills, diarrhea, abdominal pain, chest pain, or SOB.  Objective: Vital signs in last 24 hours: Filed Vitals:   01/20/14 1830 01/20/14 2010 01/20/14 2146 01/21/14 0645  BP: 106/64 112/66 108/66 100/59  Pulse: 106 100 103 97  Temp:  98.9 F (37.2 C) 98.5 F (36.9 C) 98 F (36.7 C)  TempSrc:  Oral Oral Oral  Resp: 12 15 15 16   Height:      Weight:      SpO2: 100% 100% 100% 99%   Weight change:   Intake/Output Summary (Last 24 hours) at 01/21/14 1204 Last data filed at 01/21/14 1034  Gross per 24 hour  Intake   1146 ml  Output    100 ml  Net   1046 ml   Physical Exam: General: Alert, cooperative, NAD. HEENT: PERRL, EOMI. Moist mucus membranes Neck: Full range of motion without pain, supple, no lymphadenopathy or carotid bruits Lungs: Clear to ascultation bilaterally, normal work of respiration, no wheezes, rales, rhonchi Heart: RRR, no murmurs, gallops, or rubs Abdomen: Soft, non-tender, non-distended, BS + Extremities: No cyanosis, clubbing, or edema. Right buttock w/ large area of erythema and induration (8 x 8 cm), significant tenderness on exam. Smaller area of sloughing epidermis, no obvious site of drainage or sinus. NO fluctuance.  Neurologic: Alert & oriented X3, cranial nerves II-XII intact, strength grossly intact, sensation intact to light touch  Lab Results: Basic Metabolic Panel:  Recent Labs Lab 01/19/14 2017 01/20/14 1315  NA 134* 134*  K 3.7 3.9  CL 99 97  CO2 20 22  GLUCOSE 169* 271*  BUN 7 5*  CREATININE 0.60 0.69  CALCIUM 7.9* 8.7   Liver Function Tests:  Recent Labs Lab 01/19/14 1402  AST 28  ALT 36*  ALKPHOS 101  BILITOT 0.4  PROT 8.0  ALBUMIN 3.7   CBC:  Recent Labs Lab 01/19/14 1402 01/20/14 1315  WBC 11.2* 15.4*  NEUTROABS 9.0*  --   HGB 13.1 11.5*  HCT 37.2 33.7*  MCV 88.2 88.2  PLT 232 220    CBG:  Recent Labs Lab 01/19/14 1333 01/20/14 2102 01/21/14 0741 01/21/14 1142  GLUCAP 219* 253* 138* 119*    Urinalysis:  Recent Labs Lab 01/19/14 1400 01/20/14 2213  COLORURINE YELLOW YELLOW  LABSPEC 1.010 1.018  PHURINE 6.0 6.0  GLUCOSEU 500* >1000*  HGBUR TRACE* SMALL*  BILIRUBINUR NEGATIVE NEGATIVE  KETONESUR NEGATIVE 40*  PROTEINUR NEGATIVE NEGATIVE  UROBILINOGEN 0.2 1.0  NITRITE NEGATIVE NEGATIVE  LEUKOCYTESUR NEGATIVE NEGATIVE    Micro Results: Recent Results (from the past 240 hour(s))  WOUND CULTURE     Status: None   Collection Time    01/20/14  5:38 PM      Result Value Ref Range Status   Specimen Description WOUND ANAL   Final   Special Requests Normal   Final   Gram Stain     Final   Value: RARE WBC PRESENT, PREDOMINANTLY PMN     RARE SQUAMOUS EPITHELIAL CELLS PRESENT     RARE GRAM POSITIVE COCCI     IN PAIRS     Performed at [facility]   Culture     Final   Value: NO GROWTH     Performed at [facility]   Report Status PENDING   Incomplete   Studies/Results: Ct Abdomen Pelvis W Contrast  01/19/2014   CLINICAL DATA:  43 year old female with right gluteal swelling and pain.  EXAM: CT ABDOMEN AND PELVIS WITH CONTRAST  TECHNIQUE: Multidetector CT imaging of the abdomen and pelvis was performed using the standard protocol following bolus administration of intravenous contrast.  CONTRAST:  100mL OMNIPAQUE IOHEXOL 300 MG/ML  SOLN  COMPARISON:  None.  FINDINGS: The lung bases are clear.  The liver, spleen, adrenal glands, pancreas and kidneys are unremarkable.  The patient is status post cholecystectomy.  A small amount of free pelvic fluid - likely physiologic.  There is no evidence of biliary dilatation, abdominal aortic aneurysm or enlarged lymph nodes.  The bowel, bladder and appendix are unremarkable. There is no evidence of bowel obstruction, abscess or pneumoperitoneum.  Stranding/ inflammation in the right gluteal subcutaneous fat  with associated skin thickening is noted. There is no evidence focal collection/ abscess.  No acute or suspicious bony abnormalities are identified.  IMPRESSION: Right gluteal subcutaneous inflammation/ cellulitis without abscess.  Small amount of free pelvic fluid which is likely physiologic.  No other significant abnormalities noted.   Electronically Signed   By: Laveda AbbeJeff  Hu M.D.   On: 01/19/2014 16:02   Medications: I have reviewed the patient's current medications. Scheduled Meds: . clindamycin (CLEOCIN) IV  600 mg Intravenous Q8H  . cycloSPORINE  1 drop Both Eyes Daily  . enoxaparin (LOVENOX) injection  40 mg Subcutaneous Q24H  . insulin aspart  0-15 Units Subcutaneous TID WC  . insulin aspart  0-5 Units Subcutaneous QHS  . pantoprazole  40 mg Oral Daily   Continuous Infusions: . sodium chloride     PRN Meds:.acetaminophen, ALPRAZolam, HYDROcodone-acetaminophen, ondansetron  Assessment/Plan: Ms. Francene Boyersnna Jobin is a 43 y.o. female w/ PMHx of Crohn's Disease, DM type II, Ankylosing Spondylitis, and ?Behcet's Disease, admitted for a right gluteal cellulitis.   Right Gluteal Cellulitis- Patient presented from [facility]/ new gluteal cellulitis. Significant erythema and induration over the right buttock and a smaller area of epidermal sloughing. NO obvious drainage or sinus. CT imaging showed no evidence of abscess. No fluctuance on exam. Admitted w/ leukocytosis of 15.4, mild tachycardia, and soft blood pressure, SBP in the 100's. Does not appear ill in nature. Tolerating po. No lactic acidosis on admission.  -Continue Clindamycin IV 600 mg q8h; if improved tomorrow, will switch to po -IVF; continue NS @ 150 cc/hr for 10 hours. SBP in the 100's. -Tylenol prn for fever -Vicodin 5-325 q6h prn for pain -Zofran prn nausea -Consider imaging and surgery consult in the AM if not improved and suspect an abscess.  DM Type II- Patient takes Metformin 500 mg qt home, claims this gives her abdominal discomfort,  therefore she does not take it every day. She claims her CBG's have been elevated recently in the 200's. According to the patient, her most recent HbA1c was 7.3 in March. She claims she has tried other types of Insulin and medications (Lantus, Levemir, Glipizide, and Glimepiride) and says he gets severe hypoglycemia and "reactions" when she has used these in the past. Claims that brand name "glucophage" has worked the best for her before. CBG's have been in the low 100's recently.  -Continue ISS-S -Discharge patient on Glucophage long acting Metformin as this is shown to cause less GI side effects.  -HbA1c pending   Crohn's, Ankylosing Spondylitis, ?SLE, ?Behcet's- [place] Rheumatologist there who diagnosed her with these illnesses previously. All seem stable at this time, however, will need a new PCP and rheumatologist now that she  is living in [place].  -Establish new PCP on discharge w/ referral to Rheum  GERD- Patient complaining of mild abdominal pain. Takes Zantac at home.  -Continue Protonix 40 mg po qd  Anxiety- Stable -Continue home Xanax 0.5 mg daily  Dispo: Disposition is deferred at this time, awaiting improvement of current medical problems.  Anticipated discharge in approximately 1-2 day(s).   The patient does not have a current PCP (No Pcp Per Patient) and does not need an [facility] hospital follow-up appointment after discharge.  The patient does not have transportation limitations that hinder transportation to clinic appointments.  .Services Needed at time of discharge: Y = Yes, Blank = No PT:   OT:   RN:   Equipment:   Other:     LOS: 1 day   Courtney Paris, MD 01/21/2014, 12:04 PM

## 2014-01-21 NOTE — Consult Note (Addendum)
WOC wound consult note Reason for Consult: Erythema and induration of right buttocks Wound type:Infectious Pressure Ulcer POA: No Measurement:Erythema and induration areas measures 12cm x 13cm with central area presenting with peeling epidermis (partial thickness) measuring 5cm x 4cm Wound bed:No wound at present. Area in center of peeling tissue has reepithelialized. Drainage (amount, consistency, odor) None Periwound: As described above:  Erythematous, induration and warmth.  Line drawn Monday at [facility] hospital does not have erythema extending beyond that point. Dressing procedure/placement/frequency:None at this time.  I suggest consultation with [facility] for assessment and further evaluation for infectious process to rule out abscess or other underlying issue-particularly in light of her autoimmune comorbid conditions and diabetes. WOC nursing team will not follow, but will remain available to this patient, the nursing and medical team.  Please re-consult if needed. Thanks, Ladona Mow, MSN, RN, GNP, [place], [facility]ON[facility] [phone])

## 2014-01-21 NOTE — H&P (Signed)
  Date: 01/21/2014  Patient name: Wendy Golden  Medical record number: [id number]  Date of birth: Apr 13, 1971   I have seen and evaluated Wendy Golden and discussed their care with the Residency Team.  Briefly Wendy Golden is a 43yo woman with extensive PMH including Crohn's disease, SLE, Behcet's disease, ankylosing spodylitis and DM2.  She presented with cellulitis of her right buttock which has been getting worse over the last 5 days.  It started as what felt like a pimple, which she "popped" and has gotten progressively worse.  She was seen in the [facility] ED and given Vancomycin (developed an itchy rash) and clindamycin.  She had a CT scan of the area at that time which did not show a fluid collected.  She was offered admission and declined.  She presented back to us and has been admitted.  She did have a fever, tachycardia and somewhat low blood pressure on admission.  She has a large (12X14cm) indurated area on the right buttock which is tender and erythematous, no drainage was able to expressed on my exam.  Erythematous area does extend outside the area marked at the [facility].    Assessment and Plan: I have seen and evaluated the patient as outlined above. I agree with the formulated Assessment and Plan as detailed in the residents' admission note, with the following changes:   1. Sepsis 2/2 right buttock cellulitis.   - Agree with Clindamycin IV for traetment - NS IVF for tachycardia and mildly low BP - Percocet/warm compresses for pain - No apparent fluid collection, no fluctuance today, defer surgery consult unless exam changes - F/u cultures - Good control of BP.   2. DM 2 - She will be on Novolog sliding scale.  She has GI issues with most PO medications or low blood sugars with them.  She cannot tolerate "synthetic" insulins per report - Monitor CBG closely  3. Autoimmune disorders - Currently she is not treated for these, she will need referral to PCP and likely a  Rheumatolgist and GI specialist.    Other issues per resident note.   Inez CatalinaEmily B Mullen, MD 6/17/201510:44 AM

## 2014-01-21 NOTE — Progress Notes (Addendum)
Inpatient Diabetes Program Recommendations  AACE/ADA: New Consensus Statement on Inpatient Glycemic Control (2013)  Target Ranges:  Prepandial:   less than 140 mg/dL      Peak postprandial:   less than 180 mg/dL (1-2 hours)      Critically ill patients:  140 - 180 mg/dL     Results for HARLIV, HOMMEL (MRN [id number]) as of 01/21/2014 09:43  Ref. Range 01/20/2014 21:02 01/21/2014 07:41  Glucose-Capillary Latest Range: 70-99 mg/dL 027 (H) 253 (H)     Admitted with Right Buttock Cellulitis.  +DM2, Lupus, Chron's  Home DM Meds: Metformin 1000 daily (not taking on regular basis)   **Per H&P, patient states she does not take her Metformin on a regular basis.  Per pt she can tolerate the brand name but cannot tolerate the generic (GI side effects).  **Note A1c Pending  **DM Coordinator to see patient today   MD- Patient refused Lantus last night.  AM CBG today 138 mg/dl.  MD, may want to d/c Lantus for now.   1256pm Addendum:  Spoke with patient at length about her home DM regimen.  Patient adamantly stated she cannot tolerate generic Metformin but that she would be very willing to try the Glucophage XR b/c she has had good success with that drug before.  Pt stated that she can only tolerate the brand name drug Glucophage (not generic Metformin).  Patient told me she was diagnosed with DM after her pregnancy in 1998 (had GDM with her pregnancy) and that she also often has issues with hypoglycemia at home as well.  Patient told me she has tried Glipizide, Glimepiride, and Januvia as well.  Patient stated she had "lots of lows" with Januvia.  Patient went on to tell me that she has taken Novolog SSI at home before with success when she has been on steroids.  Per pt, she cannot handle some insulins.  Is willing to take Novolog here in hospital.  Patient was concerned that the doctors wanted her to take 2 different kinds of insulin here in hospital (Lantus and Novolog).  Patient told me she refused  the Lantus b/c she felt like she didn't need it.  Explained how the 2 different insulins work and told patient that we can try Novolog alone to see how she does.  So far, patient's CBGs well controlled today 138/ 119 mg/dl.  Discussed with patient the importance of eating three meals per day and not waiting until dinner to eat one giant meal.  Patient stated she knows what and how much to eat, she just needs "to do it".      Will follow Ambrose Finland RN, MSN, CDE Diabetes Coordinator Inpatient Diabetes Program Team Pager: [phone] (8a-10p)

## 2014-01-22 DIAGNOSIS — F411 Generalized anxiety disorder: Secondary | ICD-10-CM

## 2014-01-22 DIAGNOSIS — K219 Gastro-esophageal reflux disease without esophagitis: Secondary | ICD-10-CM

## 2014-01-22 LAB — BASIC METABOLIC PANEL
BUN: 8 mg/dL (ref 6–23)
CALCIUM: 8.9 mg/dL (ref 8.4–10.5)
CO2: 24 meq/L (ref 19–32)
Chloride: 100 mEq/L (ref 96–112)
Creatinine, Ser: 0.7 mg/dL (ref 0.50–1.10)
GFR calc Af Amer: >90 mL/min (ref >90)
GFR calc non Af Amer: >90 mL/min (ref >90)
GLUCOSE: 160 mg/dL — AB (ref 70–99)
POTASSIUM: 3.7 meq/L (ref 3.7–5.3)
SODIUM: 138 meq/L (ref 137–147)

## 2014-01-22 LAB — CBC
HCT: 30.1 % — ABNORMAL LOW (ref 36.0–46.0)
Hemoglobin: 10.1 g/dL — ABNORMAL LOW (ref 12.0–15.0)
MCH: 29.8 pg (ref 26.0–34.0)
MCHC: 33.6 g/dL (ref 30.0–36.0)
MCV: 88.8 fL (ref 78.0–100.0)
PLATELETS: 231 10*3/uL (ref 150–400)
RBC: 3.39 MIL/uL — AB (ref 3.87–5.11)
RDW: 12 % (ref 11.5–15.5)
WBC: 8.8 10*3/uL (ref 4.0–10.5)

## 2014-01-22 LAB — GLUCOSE, CAPILLARY
GLUCOSE-CAPILLARY: 160 mg/dL — AB (ref 70–99)
GLUCOSE-CAPILLARY: 183 mg/dL — AB (ref 70–99)

## 2014-01-22 LAB — HEMOGLOBIN A1C
HEMOGLOBIN A1C: 9.3 % — AB (ref <5.7)
Hgb A1c MFr Bld: 9.1 % — ABNORMAL HIGH (ref <5.7)
Mean Plasma Glucose: 214 mg/dL — ABNORMAL HIGH (ref <117)
Mean Plasma Glucose: 220 mg/dL — ABNORMAL HIGH (ref <117)

## 2014-01-22 MED ORDER — METFORMIN HCL ER 500 MG PO TB24: 500.0000 mg | ORAL_TABLET | Freq: Every day | ORAL | Status: AC

## 2014-01-22 MED ORDER — ONDANSETRON HCL 4 MG PO TABS: 4.0000 mg | ORAL_TABLET | Freq: Three times a day (TID) | ORAL | Status: AC | PRN

## 2014-01-22 MED ORDER — CLINDAMYCIN HCL 300 MG PO CAPS: 300.0000 mg | ORAL_CAPSULE | Freq: Three times a day (TID) | ORAL | Status: DC

## 2014-01-22 NOTE — Progress Notes (Signed)
Subjective: Patient seen at bedside this AM. Says she is feeling better, appetite improved, no nausea, vomiting, abdominal pain. Right gluteal cellulitis appears improved, however, sanguinous drainage noted on dressing. Drainage site seen at center of erythema. Still without fluctuance. Area of induration decreased from yesterday. Afebrile overnight.   Objective: Vital signs in last 24 hours: Filed Vitals:   01/21/14 0645 01/21/14 1420 01/21/14 2154 01/22/14 0531  BP: 100/59 92/60 82/49  98/57  Pulse: 97 94 91 77  Temp: 98 F (36.7 C) 99.1 F (37.3 C) 99.5 F (37.5 C) 98.8 F (37.1 C)  TempSrc: Oral Oral Oral Oral  Resp: 16 16 20 20   Height:      Weight:  150 lb 11.2 oz (68.357 kg)    SpO2: 99% 96% 100% 95%   Weight change: 9.6 oz (0.272 kg)  Intake/Output Summary (Last 24 hours) at 01/22/14 0726 Last data filed at 01/22/14 0531  Gross per 24 hour  Intake    800 ml  Output      0 ml  Net    800 ml   Physical Exam: General: Alert, cooperative, NAD. HEENT: PERRL, EOMI. Moist mucus membranes Neck: Full range of motion without pain, supple, no lymphadenopathy or carotid bruits Lungs: Clear to ascultation bilaterally, normal work of respiration, no wheezes, rales, rhonchi Heart: RRR, no murmurs, gallops, or rubs Abdomen: Soft, non-tender, non-distended, BS + Extremities: No cyanosis, clubbing, or edema. Right buttock w/ area of erythema and induration (6 x 6 cm), still w/ moderate tenderness on exam. Small area of sloughing epidermis. Sanguinous drainage seen on dressing. Small drainage site near center of lesion. Neurologic: Alert & oriented X3, cranial nerves II-XII intact, strength grossly intact, sensation intact to light touch  Lab Results: Basic Metabolic Panel:  Recent Labs Lab 01/20/14 1315 01/21/14 1400 01/22/14 0503  NA 134* 139 138  K 3.9 3.7 3.7  CL 97 102 100  CO2 22 22 24   GLUCOSE 271* 138* 160*  BUN 5* 5* 8  CREATININE 0.69 0.60 0.70  CALCIUM 8.7 8.8  8.9  MG  --  2.1  --    Liver Function Tests:  Recent Labs Lab 01/19/14 1402  AST 28  ALT 36*  ALKPHOS 101  BILITOT 0.4  PROT 8.0  ALBUMIN 3.7   CBC:  Recent Labs Lab 01/19/14 1402  01/21/14 1400 01/22/14 0503  WBC 11.2*  < > 10.6* 8.8  NEUTROABS 9.0*  --  8.7*  --   HGB 13.1  < > 11.3* 10.1*  HCT 37.2  < > 32.9* 30.1*  MCV 88.2  < > 88.4 88.8  PLT 232  < > 232 231  < > = values in this interval not displayed. CBG:  Recent Labs Lab 01/19/14 1333 01/20/14 2102 01/21/14 0741 01/21/14 1142 01/21/14 1655 01/21/14 2156  GLUCAP 219* 253* 138* 119* 132* 146*    Urinalysis:  Recent Labs Lab 01/19/14 1400 01/20/14 2213  COLORURINE YELLOW YELLOW  LABSPEC 1.010 1.018  PHURINE 6.0 6.0  GLUCOSEU 500* >1000*  HGBUR TRACE* SMALL*  BILIRUBINUR NEGATIVE NEGATIVE  KETONESUR NEGATIVE 40*  PROTEINUR NEGATIVE NEGATIVE  UROBILINOGEN 0.2 1.0  NITRITE NEGATIVE NEGATIVE  LEUKOCYTESUR NEGATIVE NEGATIVE    Micro Results: Recent Results (from the past 240 hour(s))  WOUND CULTURE     Status: None   Collection Time    01/20/14  5:38 PM      Result Value Ref Range Status   Specimen Description WOUND ANAL   Final  Special Requests Normal   Final   Gram Stain     Final   Value: RARE WBC PRESENT, PREDOMINANTLY PMN     RARE SQUAMOUS EPITHELIAL CELLS PRESENT     RARE GRAM POSITIVE COCCI     IN PAIRS     Performed at [facility]   Culture     Final   Value: NO GROWTH     Performed at [facility]   Report Status PENDING   Incomplete   Studies/Results: No results found.  Medications: I have reviewed the patient's current medications. Scheduled Meds: . clindamycin (CLEOCIN) IV  600 mg Intravenous Q8H  . cycloSPORINE  1 drop Both Eyes Daily  . enoxaparin (LOVENOX) injection  40 mg Subcutaneous Q24H  . insulin aspart  0-15 Units Subcutaneous TID WC  . insulin aspart  0-5 Units Subcutaneous QHS  . pantoprazole  40 mg Oral Daily  . senna-docusate  1  tablet Oral BID   Continuous Infusions:   PRN Meds:.acetaminophen, ALPRAZolam, HYDROcodone-acetaminophen, ondansetron  Assessment/Plan: Ms. Rakeisha Bandura is a 43 y.o. female w/ PMHx of Crohn's Disease, DM type II, Ankylosing Spondylitis, and ?Behcet's Disease, admitted for a right gluteal cellulitis.   Right Gluteal Cellulitis- Erythematous area decreased in size, induration also improved. Drainage on dressing this morning, w/ small site of drainage seen at center of lesion. Bedside US performed today, did not show significant fluid pocket suggestive of abscess, only soft tissue edema. WBC's 8.8 this AM.  -Change to Clindamycin 300 mg po tid today. Will complete total 10 day course (ending on 01/29/14). -Tylenol prn for fever -Vicodin 5-325 q6h prn for pain -Zofran prn nausea  DM Type II- CBG's well controlled on ISS, trend as follows:   Recent Labs Lab 01/20/14 2102 01/21/14 0741 01/21/14 1142 01/21/14 1655 01/21/14 2156  GLUCAP 253* 138* 119* 132* 146*  -Continue ISS-S -Discharge patient on Glucophage long acting Metformin as this is shown to cause less GI side effects.  -HbA1c pending   Crohn's, Ankylosing Spondylitis, ?SLE, ?Behcet's- Stable -Establish new PCP on discharge for referral to rheumatology + gastroenterologist  GERD- Stable -Continue Protonix 40 mg po qd  Anxiety- Stable -Continue home Xanax 0.5 mg daily  Dispo: Anticipated discharge today.   The patient does not have a current PCP (No Pcp Per Patient) and does not need an [facility] hospital follow-up appointment after discharge.  The patient does not have transportation limitations that hinder transportation to clinic appointments.  .Services Needed at time of discharge: Y = Yes, Blank = No PT:   OT:   RN:   Equipment:   Other:     LOS: 2 days   Courtney Paris, MD 01/22/2014, 7:26 AM

## 2014-01-22 NOTE — Discharge Instructions (Signed)
1. Call the following clinic for hospital follow up appointment:  Otis Brace, MD  On 01/28/2014 @ 10:45 AM  [address] [place] [address] [phone]  2. Please take all medications as prescribed.   Please start taking Glucophage XR 500 mg daily. This is a LONG ACTING Glucophage, less likely to give you GI symptoms.   Continue taking Clindamycin 300 mg three times daily for 7 more days (ending on 01/29/14).  Take Zofran every 8 hours as needed for nausea.  Take previously prescribed Percocet every 4-6 hours for pain.  3. If you have worsening of your symptoms or new symptoms arise, please call PCP or go to the ER immediately if symptoms are severe.     Cellulitis Cellulitis is an infection of the skin and the tissue beneath it. The infected area is usually red and tender. Cellulitis occurs most often in the arms and lower legs.  CAUSES  Cellulitis is caused by bacteria that enter the skin through cracks or cuts in the skin. The most common types of bacteria that cause cellulitis are Staphylococcus and Streptococcus. SYMPTOMS   Redness and warmth.  Swelling.  Tenderness or pain.  Fever. DIAGNOSIS  Your caregiver can usually determine what is wrong based on a physical exam. Blood tests may also be done. TREATMENT  Treatment usually involves taking an antibiotic medicine. HOME CARE INSTRUCTIONS   Take your antibiotics as directed. Finish them even if you start to feel better.  Keep the infected arm or leg elevated to reduce swelling.  Apply a warm cloth to the affected area up to 4 times per day to relieve pain.  Only take over-the-counter or prescription medicines for pain, discomfort, or fever as directed by your caregiver.  Keep all follow-up appointments as directed by your caregiver. SEEK MEDICAL CARE IF:   You notice red streaks coming from the infected area.  Your red area gets larger or turns dark in color.  Your bone or joint underneath the  infected area becomes painful after the skin has healed.  Your infection returns in the same area or another area.  You notice a swollen bump in the infected area.  You develop new symptoms. SEEK IMMEDIATE MEDICAL CARE IF:   You have a fever.  You feel very sleepy.  You develop vomiting or diarrhea.  You have a general ill feeling (malaise) with muscle aches and pains. MAKE SURE YOU:   Understand these instructions.  Will watch your condition.  Will get help right away if you are not doing well or get worse. Document Released: 05/03/2005 Document Revised: 01/23/2012 Document Reviewed: 10/09/2011 [facility] Patient Information 2015 [place], [place]. This information is not intended to replace advice given to you by your health care provider. Make sure you discuss any questions you have with your health care provider.

## 2014-01-22 NOTE — Discharge Summary (Signed)
Name: Wendy Golden MRN: [id number] DOB: September 27, 1970 43 y.o. PCP: No Pcp Per Patient  Date of Admission: 01/20/2014  3:53 PM Date of Discharge: 01/22/2014 Attending Physician: Inez Catalina, MD  Discharge Diagnosis: 1. Right Gluteal Cellulitis 2. DM Type II  Discharge Medications:   Medication List    STOP taking these medications       metFORMIN 1000 MG tablet  Commonly known as:  GLUCOPHAGE  Replaced by:  metFORMIN 500 MG 24 hr tablet      TAKE these medications       ALPRAZolam 1 MG tablet  Commonly known as:  XANAX  Take 0.5 mg by mouth daily as needed for anxiety.     clindamycin 300 MG capsule  Commonly known as:  CLEOCIN  Take 1 capsule (300 mg total) by mouth 3 (three) times daily.     HAIR/SKIN/NAILS/BIOTIN Tabs  Take 1 tablet by mouth daily.     MULTIVITAMINS Chew  Chew 1 tablet by mouth daily.     ibuprofen 200 MG tablet  Commonly known as:  ADVIL,MOTRIN  Take 400 tablets by mouth daily as needed.     metFORMIN 500 MG 24 hr tablet  Commonly known as:  GLUCOPHAGE XR  Take 1 tablet (500 mg total) by mouth daily with breakfast.     ondansetron 4 MG tablet  Commonly known as:  ZOFRAN  Take 1 tablet (4 mg total) by mouth every 8 (eight) hours as needed for nausea or vomiting.     oxyCODONE-acetaminophen 5-325 MG per tablet  Commonly known as:  PERCOCET/ROXICET  Take 2 tablets by mouth every 4 (four) hours as needed for severe pain.     ranitidine 75 MG tablet  Commonly known as:  ZANTAC  Take 1 tablet by mouth daily.     RESTASIS 0.05 % ophthalmic emulsion  Generic drug:  cycloSPORINE  Place 1 drop into both eyes daily.     Vitamin C Chew  Chew 1 tablet by mouth daily.        Disposition and follow-up:   [name] was discharged from [facility] in Good condition.  At the hospital follow up visit please address:  1.  Right Gluteal Cellulitis: Bedside US performed on day of discharge, no abscess identified. Has  erythema and induration completely resolved? Any tenderness? Drainage? Has patient had fever, chills, nausea, or vomiting? Has patient finished full course of Clindamycin?  DM Type II: Patient w/ recently elevated CBG's. Most recent HbA1c according to the patient ~7.0, however, found to be 9.3 during this admission. Has taken ONLY Metformin 500 mg daily at home (not always compliant 2/2 GI issues). CBG's stable during hospitalization. REFUSES Lantus, says it makes her sick. Glipizide makes her blood sugars tank for days, according to the patient. Discharged on Metformin XR 500 mg po qd. If tolerating w/out GI discomfort, can increase as tolerated. May need addition of Novolog Insulin if not tolerating adequate dose of Metformin. VERY unwilling to try other medications.  Crohn's, Ankylosing Spondylitis, SLE, Behcet's: Patient from [place], previously followed by gastroenterologist and rheumatologist. Has not been treated w/ specific medications for these illnesses before, but claims she was told she should be on Humira in the past. NEEDS REFERRAL FOR GI AND RHEUMATOLOGY.   2.  Labs / imaging needed at time of follow-up: None (unless patient unwell, order CBC for assessment of leukocytosis)  3.  Pending labs/ test needing follow-up: none  Follow-up Appointments:  Follow-up Information   Follow up with Otis BraceABBANI, MARJAN, MD On 01/28/2014. (10:45 AM)    Specialty:  Internal Medicine   Contact information:   [address] [place] [address] [phone]       Discharge Instructions: Discharge Instructions   Call MD for:  persistant nausea and vomiting    Complete by:  As directed      Call MD for:  redness, tenderness, or signs of infection (pain, swelling, redness, odor or green/yellow discharge around incision site)    Complete by:  As directed      Call MD for:  severe uncontrolled pain    Complete by:  As directed      Call MD for:  temperature >100.4    Complete by:  As directed        Increase activity slowly    Complete by:  As directed            Consultations:   Wound Care  Procedures Performed:  Ct Abdomen Pelvis W Contrast  01/19/2014   CLINICAL DATA:  43 year old female with right gluteal swelling and pain.  EXAM: CT ABDOMEN AND PELVIS WITH CONTRAST  TECHNIQUE: Multidetector CT imaging of the abdomen and pelvis was performed using the standard protocol following bolus administration of intravenous contrast.  CONTRAST:  100mL OMNIPAQUE IOHEXOL 300 MG/ML  SOLN  COMPARISON:  None.  FINDINGS: The lung bases are clear.  The liver, spleen, adrenal glands, pancreas and kidneys are unremarkable.  The patient is status post cholecystectomy.  A small amount of free pelvic fluid - likely physiologic.  There is no evidence of biliary dilatation, abdominal aortic aneurysm or enlarged lymph nodes.  The bowel, bladder and appendix are unremarkable. There is no evidence of bowel obstruction, abscess or pneumoperitoneum.  Stranding/ inflammation in the right gluteal subcutaneous fat with associated skin thickening is noted. There is no evidence focal collection/ abscess.  No acute or suspicious bony abnormalities are identified.  IMPRESSION: Right gluteal subcutaneous inflammation/ cellulitis without abscess.  Small amount of free pelvic fluid which is likely physiologic.  No other significant abnormalities noted.   Electronically Signed   By: Laveda AbbeJeff  Hu M.D.   On: 01/19/2014 16:02   Admission HPI: Ms. Wendy Golden is a 43 year old woman with a PMH of Crohn's disease with prior fistula, SLE, Behcet's, ankylosing spondylitis and DM type 2 who presents for left buttock redness, swelling and pain x 5 days. She reports waking up last Friday morning with an itchy, red pot on her right buttock. She notes having had a similar area on her thigh a few weeks ago that looked like a bug bite and resolved on its own. This time she scratched it and it began to drain a bloody discharge. The area began to enlarge  and she noted it was warm, hard to the touch and continued to drain serosanguinous fluid. Associated symptoms include nausea, chills and abdominal discomfort. She denies vomiting, change in bowel or bladder habits from baseline, chest pain, dyspnea or decreased po. She tried ibuprofen and warm compresses to relieve the discomfort. The site was not improving so she went [facility] yesterday and was diagnosed with cellulitis. She was initially treated with Vancomycin in the [facility] ED, which she developed an allergic reaction to (itchy rash). She was then given Clindamycin, was noted to be tachycardic and admission was recommended. However, she refused admission and left against medical advice. She was given po Clindamycin, which she did not  fill, to continue as an outpatient. The erythematous area was demarcated with a marker and she was instructed to seek medical attention if the area reached the line or beyond. She presents today because the area of redness is has progressed beyond the demarcation this AM.  In the ED: T 98.83F, RR 20, SpO2 100%, HR 107, BP 106/15mmHg; she received IV clindamycin and Percocet.  Hospital Course by problem list:   1. Right Gluteal Cellulitis- Admitted from [facility] w/ worsening gluteal infection. Found to have resulting tachycardia and leukocytosis. Erythematous area on right buttock, warm and significantly indurated. No evidence of abscess on CT at [facility] (01/19/14). No lactic acidosis. Started on Clindamycin 600 mg q8h, IVF, Vicodin prn for pain, Zofran prn for nausea. During first day of admission, no obvious drainage. On day of discharge, found to have purulent/sanguinous drainage and small drainage site. Bedside US performed to assess for newly formed abscess, however, no fluid pocket seen, only soft tissue edema. Additionally, erythematous area decreased in size, induration improved, somewhat decreased tenderness on exam. No further systemic symptoms on discharge day.  Leukocytosis resolved. Changed to Clindamycin 300 mg po tid for a total of 10 day course (ending 01/29/14).   2. DM Type II- Diagnosed in 1999. Patient reports having been on Glucophage since diagnosis, up until three years ago when her insurance stopped covering the name brand. She has since been on generic metformin but says she cannot tolerate the GI side effects (which she feels are worse with generic) so she rarely takes this medication (she will take it if her BG is in the high 200s). She claims her most recent HbA1c was 7.8 two months ago. Elevated CBG's obviously contributed to gluteal cellulitis. Started on ISS-S while admitted, Metformin held. Patient refused Lantus as she says this has not made her feel good in the past. Also refuses Glipizide for outpatient treatment as this has made her severely hypoglycemic. CBG's during admission as follows:   Recent Labs Lab 01/21/14 1142 01/21/14 1655 01/21/14 2156 01/22/14 0806 01/22/14 1217  GLUCAP 119* 132* 146* 160* 183*  Discharged on Glucophage XR 500 mg po qd, as long acting Metformin shown to have less GI side-effects. May need additional medications as HbA1c prior to discharge found to be 9.3.  Discharge Vitals:   BP 98/57  Pulse 77  Temp(Src) 98.8 F (37.1 C) (Oral)  Resp 20  Ht 5\' 3"  (1.6 m)  Wt 150 lb 11.2 oz (68.357 kg)  BMI 26.70 kg/m2  SpO2 95%  Discharge Labs:  Results for orders placed during the hospital encounter of 01/20/14 (from the past 24 hour(s))  BASIC METABOLIC PANEL     Status: Abnormal   Collection Time    01/21/14  2:00 PM      Result Value Ref Range   Sodium 139  137 - 147 mEq/L   Potassium 3.7  3.7 - 5.3 mEq/L   Chloride 102  96 - 112 mEq/L   CO2 22  19 - 32 mEq/L   Glucose, Bld 138 (*) 70 - 99 mg/dL   BUN 5 (*) 6 - 23 mg/dL   Creatinine, Ser 1.61  0.50 - 1.10 mg/dL   Calcium 8.8  8.4 - 09.6 mg/dL   GFR calc non Af Amer >90  >90 mL/min   GFR calc Af Amer >90  >90 mL/min  MAGNESIUM     Status:  None   Collection Time    01/21/14  2:00 PM  Result Value Ref Range   Magnesium 2.1  1.5 - 2.5 mg/dL  CBC WITH DIFFERENTIAL     Status: Abnormal   Collection Time    01/21/14  2:00 PM      Result Value Ref Range   WBC 10.6 (*) 4.0 - 10.5 K/uL   RBC 3.72 (*) 3.87 - 5.11 MIL/uL   Hemoglobin 11.3 (*) 12.0 - 15.0 g/dL   HCT 35.6 (*) 70.1 - 41.0 %   MCV 88.4  78.0 - 100.0 fL   MCH 30.4  26.0 - 34.0 pg   MCHC 34.3  30.0 - 36.0 g/dL   RDW 30.1  31.4 - 38.8 %   Platelets 232  150 - 400 K/uL   Neutrophils Relative % 83 (*) 43 - 77 %   Neutro Abs 8.7 (*) 1.7 - 7.7 K/uL   Lymphocytes Relative 8 (*) 12 - 46 %   Lymphs Abs 0.9  0.7 - 4.0 K/uL   Monocytes Relative 8  3 - 12 %   Monocytes Absolute 0.9  0.1 - 1.0 K/uL   Eosinophils Relative 1  0 - 5 %   Eosinophils Absolute 0.1  0.0 - 0.7 K/uL   Basophils Relative 0  0 - 1 %   Basophils Absolute 0.0  0.0 - 0.1 K/uL  GLUCOSE, CAPILLARY     Status: Abnormal   Collection Time    01/21/14  4:55 PM      Result Value Ref Range   Glucose-Capillary 132 (*) 70 - 99 mg/dL  GLUCOSE, CAPILLARY     Status: Abnormal   Collection Time    01/21/14  9:56 PM      Result Value Ref Range   Glucose-Capillary 146 (*) 70 - 99 mg/dL   Comment 1 Notify RN    CBC     Status: Abnormal   Collection Time    01/22/14  5:03 AM      Result Value Ref Range   WBC 8.8  4.0 - 10.5 K/uL   RBC 3.39 (*) 3.87 - 5.11 MIL/uL   Hemoglobin 10.1 (*) 12.0 - 15.0 g/dL   HCT 87.5 (*) 79.7 - 28.2 %   MCV 88.8  78.0 - 100.0 fL   MCH 29.8  26.0 - 34.0 pg   MCHC 33.6  30.0 - 36.0 g/dL   RDW 06.0  15.6 - 15.3 %   Platelets 231  150 - 400 K/uL  BASIC METABOLIC PANEL     Status: Abnormal   Collection Time    01/22/14  5:03 AM      Result Value Ref Range   Sodium 138  137 - 147 mEq/L   Potassium 3.7  3.7 - 5.3 mEq/L   Chloride 100  96 - 112 mEq/L   CO2 24  19 - 32 mEq/L   Glucose, Bld 160 (*) 70 - 99 mg/dL   BUN 8  6 - 23 mg/dL   Creatinine, Ser 7.94  0.50 - 1.10 mg/dL    Calcium 8.9  8.4 - 32.7 mg/dL   GFR calc non Af Amer >90  >90 mL/min   GFR calc Af Amer >90  >90 mL/min  GLUCOSE, CAPILLARY     Status: Abnormal   Collection Time    01/22/14  8:06 AM      Result Value Ref Range   Glucose-Capillary 160 (*) 70 - 99 mg/dL  GLUCOSE, CAPILLARY     Status: Abnormal   Collection Time    01/22/14 12:17  PM      Result Value Ref Range   Glucose-Capillary 183 (*) 70 - 99 mg/dL    Signed: Courtney Paris, MD 01/22/2014, 1:46 PM   Time Spent on Discharge: 35 minutes Services Ordered on Discharge: none Equipment Ordered on Discharge: none

## 2014-01-22 NOTE — Progress Notes (Signed)
  Date: 01/22/2014  Patient name: Wendy Golden  Medical record number: [id number]  Date of birth: 03-May-1971   This patient has been seen and the plan of care was discussed with the house staff. Please see their note for complete details. I concur with their findings with the following additions/corrections:  Patient improved today, draining but with no clear fluctuance.  Switch to PO antibiotics today.  She is likely to be discharged today.   Inez Catalina, MD 01/22/2014, 12:18 PM

## 2014-01-23 ENCOUNTER — Telehealth (HOSPITAL_BASED_OUTPATIENT_CLINIC_OR_DEPARTMENT_OTHER): Payer: Self-pay | Admitting: Emergency Medicine

## 2014-01-23 LAB — WOUND CULTURE: Special Requests: NORMAL

## 2014-01-27 LAB — CULTURE, BLOOD (ROUTINE X 2)
CULTURE: NO GROWTH
CULTURE: NO GROWTH

## 2014-01-27 NOTE — Discharge Summary (Signed)
I saw Ms. Wendy Golden on day of discharge and assisted in the discharge planning.

## 2014-01-28 ENCOUNTER — Ambulatory Visit: Payer: Medicare HMO | Admitting: Internal Medicine

## 2014-02-05 ENCOUNTER — Ambulatory Visit: Payer: Medicare HMO | Admitting: Internal Medicine

## 2014-04-06 ENCOUNTER — Other Ambulatory Visit: Payer: Self-pay | Admitting: Family Medicine

## 2014-04-06 DIAGNOSIS — R1084 Generalized abdominal pain: Secondary | ICD-10-CM

## 2014-04-14 ENCOUNTER — Other Ambulatory Visit: Payer: Medicare HMO

## 2014-04-15 ENCOUNTER — Encounter: Payer: Self-pay | Admitting: General Surgery

## 2014-05-12 ENCOUNTER — Ambulatory Visit: Payer: Medicare HMO | Admitting: Cardiology

## 2014-06-10 ENCOUNTER — Encounter: Payer: Self-pay | Admitting: Cardiology

## 2015-01-01 ENCOUNTER — Other Ambulatory Visit: Payer: Self-pay | Admitting: Family Medicine

## 2015-01-01 DIAGNOSIS — R2231 Localized swelling, mass and lump, right upper limb: Secondary | ICD-10-CM

## 2015-01-06 ENCOUNTER — Other Ambulatory Visit: Payer: Self-pay | Admitting: Family Medicine

## 2015-01-06 DIAGNOSIS — R2231 Localized swelling, mass and lump, right upper limb: Secondary | ICD-10-CM

## 2015-03-01 ENCOUNTER — Other Ambulatory Visit: Payer: Self-pay

## 2015-03-11 ENCOUNTER — Other Ambulatory Visit: Payer: Self-pay

## 2015-03-12 ENCOUNTER — Other Ambulatory Visit: Payer: Self-pay | Admitting: Family Medicine

## 2015-03-12 DIAGNOSIS — R1084 Generalized abdominal pain: Secondary | ICD-10-CM

## 2015-03-19 ENCOUNTER — Emergency Department (HOSPITAL_COMMUNITY)
Admission: EM | Admit: 2015-03-19 | Discharge: 2015-03-20 | Disposition: A | Discharge status: Home / Self Care | Payer: Commercial Managed Care - HMO | Attending: Emergency Medicine | Admitting: Emergency Medicine

## 2015-03-19 ENCOUNTER — Encounter (HOSPITAL_COMMUNITY): Payer: Self-pay

## 2015-03-19 DIAGNOSIS — Z8679 Personal history of other diseases of the circulatory system: Secondary | ICD-10-CM | POA: Diagnosis not present

## 2015-03-19 DIAGNOSIS — Z8739 Personal history of other diseases of the musculoskeletal system and connective tissue: Secondary | ICD-10-CM | POA: Insufficient documentation

## 2015-03-19 DIAGNOSIS — K219 Gastro-esophageal reflux disease without esophagitis: Secondary | ICD-10-CM | POA: Insufficient documentation

## 2015-03-19 DIAGNOSIS — Z872 Personal history of diseases of the skin and subcutaneous tissue: Secondary | ICD-10-CM | POA: Insufficient documentation

## 2015-03-19 DIAGNOSIS — Y92481 Parking lot as the place of occurrence of the external cause: Secondary | ICD-10-CM | POA: Insufficient documentation

## 2015-03-19 DIAGNOSIS — M546 Pain in thoracic spine: Secondary | ICD-10-CM

## 2015-03-19 DIAGNOSIS — Y998 Other external cause status: Secondary | ICD-10-CM | POA: Diagnosis not present

## 2015-03-19 DIAGNOSIS — E119 Type 2 diabetes mellitus without complications: Secondary | ICD-10-CM | POA: Insufficient documentation

## 2015-03-19 DIAGNOSIS — S24109A Unspecified injury at unspecified level of thoracic spinal cord, initial encounter: Secondary | ICD-10-CM | POA: Diagnosis not present

## 2015-03-19 DIAGNOSIS — Z79899 Other long term (current) drug therapy: Secondary | ICD-10-CM | POA: Insufficient documentation

## 2015-03-19 DIAGNOSIS — M62838 Other muscle spasm: Secondary | ICD-10-CM | POA: Diagnosis not present

## 2015-03-19 DIAGNOSIS — Y9389 Activity, other specified: Secondary | ICD-10-CM | POA: Diagnosis not present

## 2015-03-19 DIAGNOSIS — Z91040 Latex allergy status: Secondary | ICD-10-CM | POA: Diagnosis not present

## 2015-03-19 DIAGNOSIS — S199XXA Unspecified injury of neck, initial encounter: Secondary | ICD-10-CM | POA: Diagnosis present
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
## 2015-03-20 MED ORDER — METHOCARBAMOL 500 MG PO TABS: 500.0000 mg | ORAL_TABLET | Freq: Two times a day (BID) | ORAL | Status: AC

## 2015-03-20 MED ORDER — NAPROXEN 250 MG PO TABS
500.0000 mg | ORAL_TABLET | Freq: Once | ORAL | Status: DC
  Filled 2015-03-20: qty 2

## 2015-03-20 MED ORDER — NAPROXEN SODIUM 550 MG PO TABS: 550.0000 mg | ORAL_TABLET | Freq: Two times a day (BID) | ORAL | Status: AC
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
## 2015-03-25 ENCOUNTER — Other Ambulatory Visit: Payer: Self-pay | Admitting: Family Medicine

## 2015-03-25 ENCOUNTER — Ambulatory Visit
Admission: RE | Admit: 2015-03-25 | Discharge: 2015-03-25 | Disposition: A | Discharge status: Home / Self Care | Payer: Commercial Managed Care - HMO | Source: Ambulatory Visit | Attending: Family Medicine | Admitting: Family Medicine

## 2015-03-25 ENCOUNTER — Inpatient Hospital Stay: Admission: RE | Admit: 2015-03-25 | Payer: Self-pay | Source: Ambulatory Visit

## 2015-03-25 DIAGNOSIS — R2231 Localized swelling, mass and lump, right upper limb: Secondary | ICD-10-CM

## 2015-03-25 DIAGNOSIS — R1084 Generalized abdominal pain: Secondary | ICD-10-CM

## 2015-03-25 MED ORDER — IOPAMIDOL (ISOVUE-300) INJECTION 61%
100.0000 mL | Freq: Once | INTRAVENOUS | Status: AC | PRN
  Administered 2015-03-25: 100 mL via INTRAVENOUS

## 2015-04-16 ENCOUNTER — Other Ambulatory Visit: Payer: Self-pay | Admitting: Family Medicine

## 2015-04-16 ENCOUNTER — Ambulatory Visit
Admission: RE | Admit: 2015-04-16 | Discharge: 2015-04-16 | Disposition: A | Discharge status: Home / Self Care | Payer: Commercial Managed Care - HMO | Source: Ambulatory Visit | Attending: Family Medicine | Admitting: Family Medicine

## 2015-04-16 DIAGNOSIS — M542 Cervicalgia: Secondary | ICD-10-CM

## 2015-04-16 DIAGNOSIS — M459 Ankylosing spondylitis of unspecified sites in spine: Secondary | ICD-10-CM

## 2017-09-08 IMAGING — CR DG CERVICAL SPINE 2 OR 3 VIEWS
5 series · 5 of 5 positions shown · non-contrast
Comparison: None.

CLINICAL DATA: Generalized pain.  Initial evaluation .

EXAM:
CERVICAL SPINE - 2-3 VIEW

[w cervical spine lat]
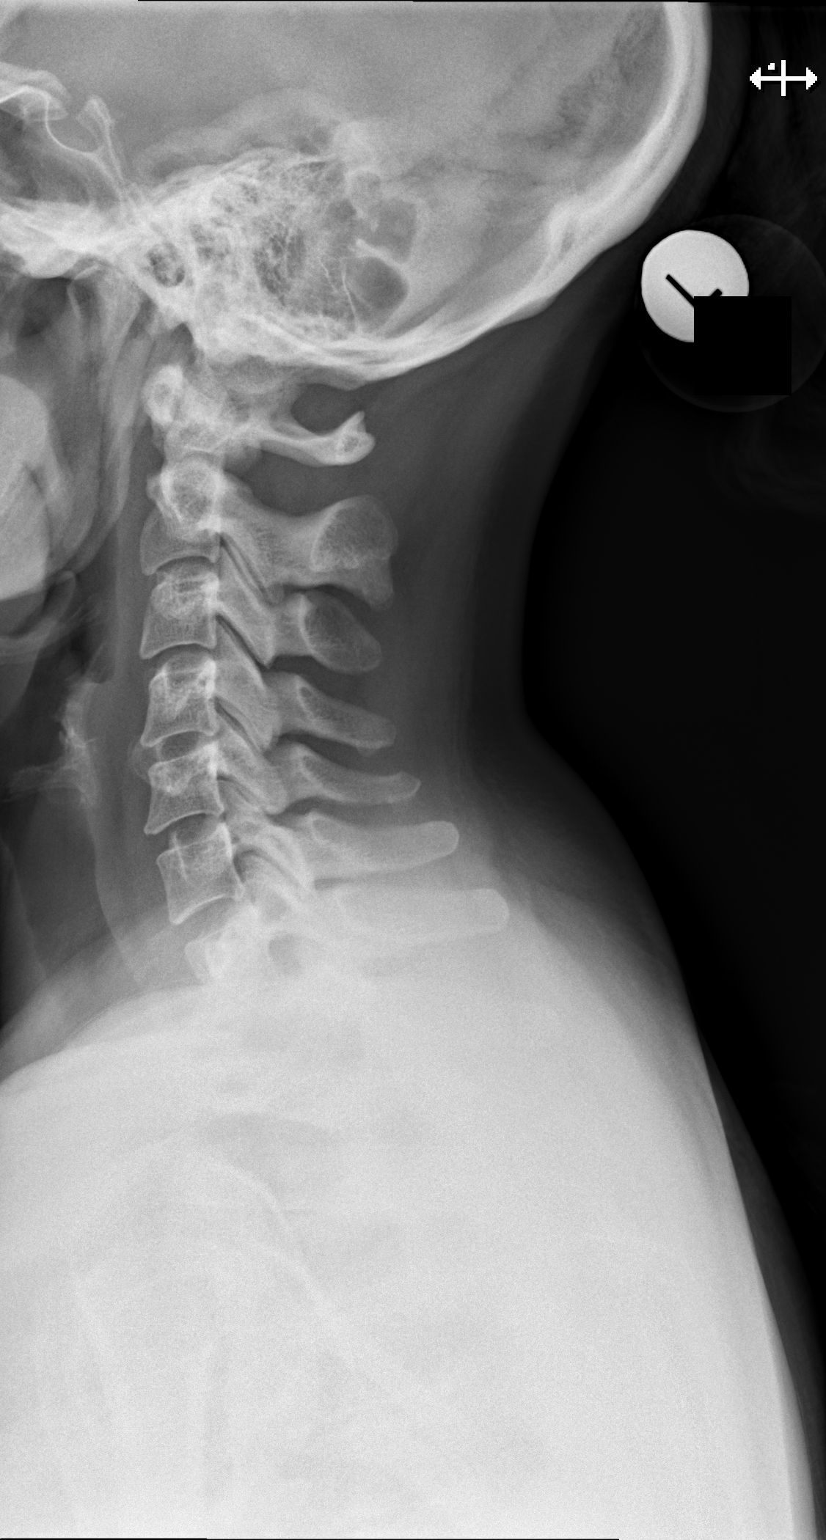

[w cervical swimmers]
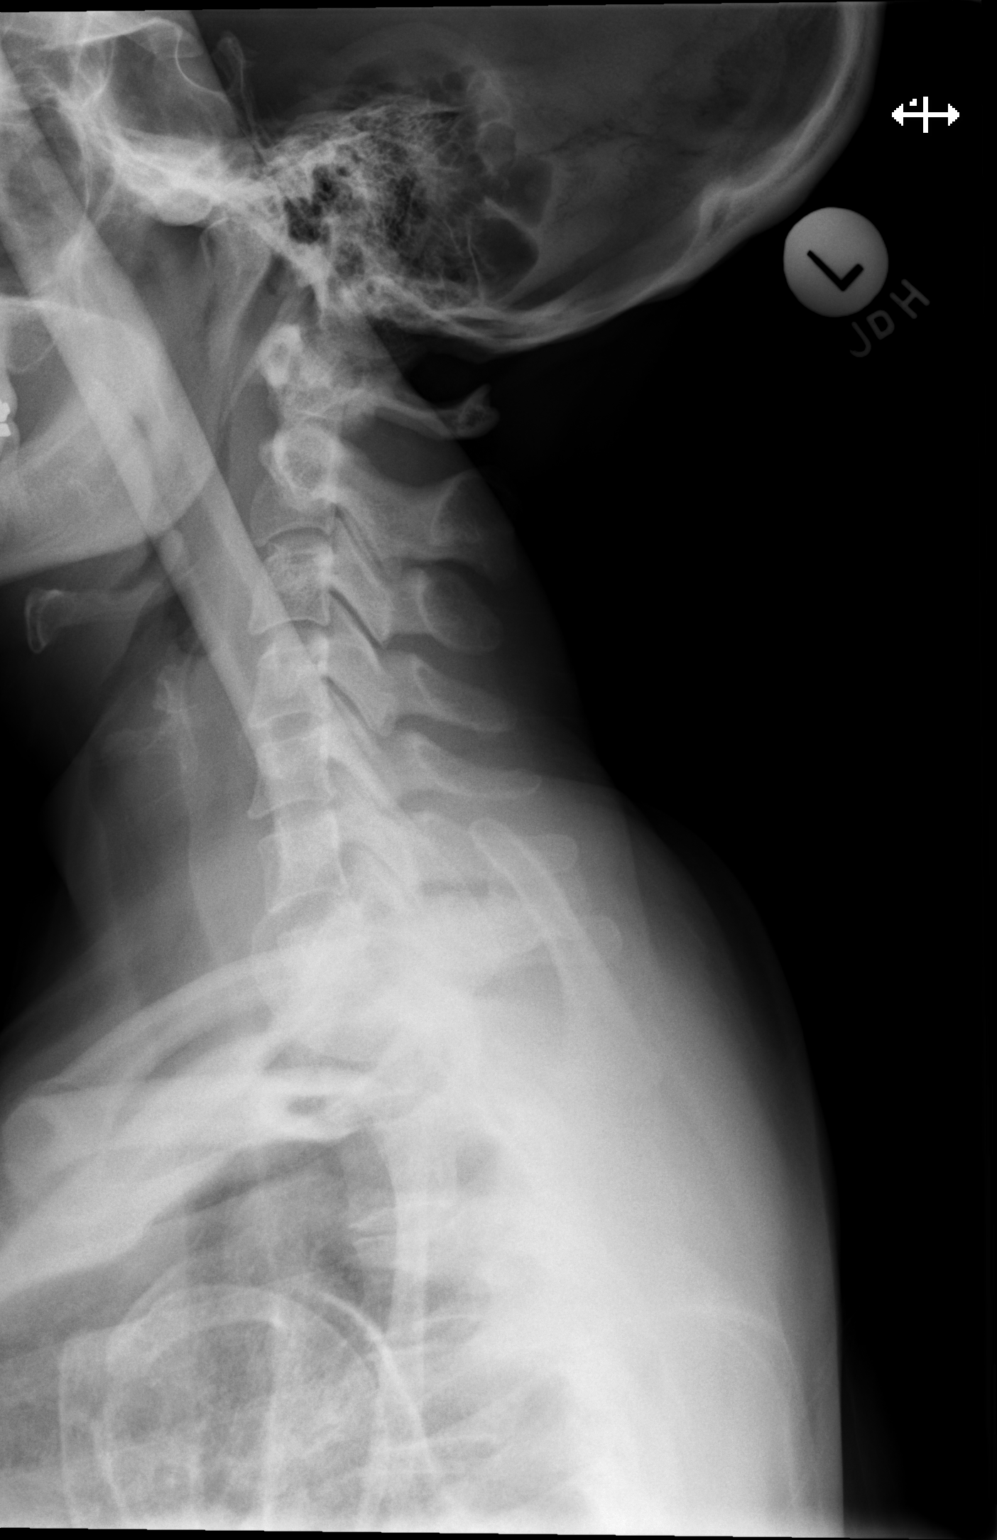

[w cervical spine ap]
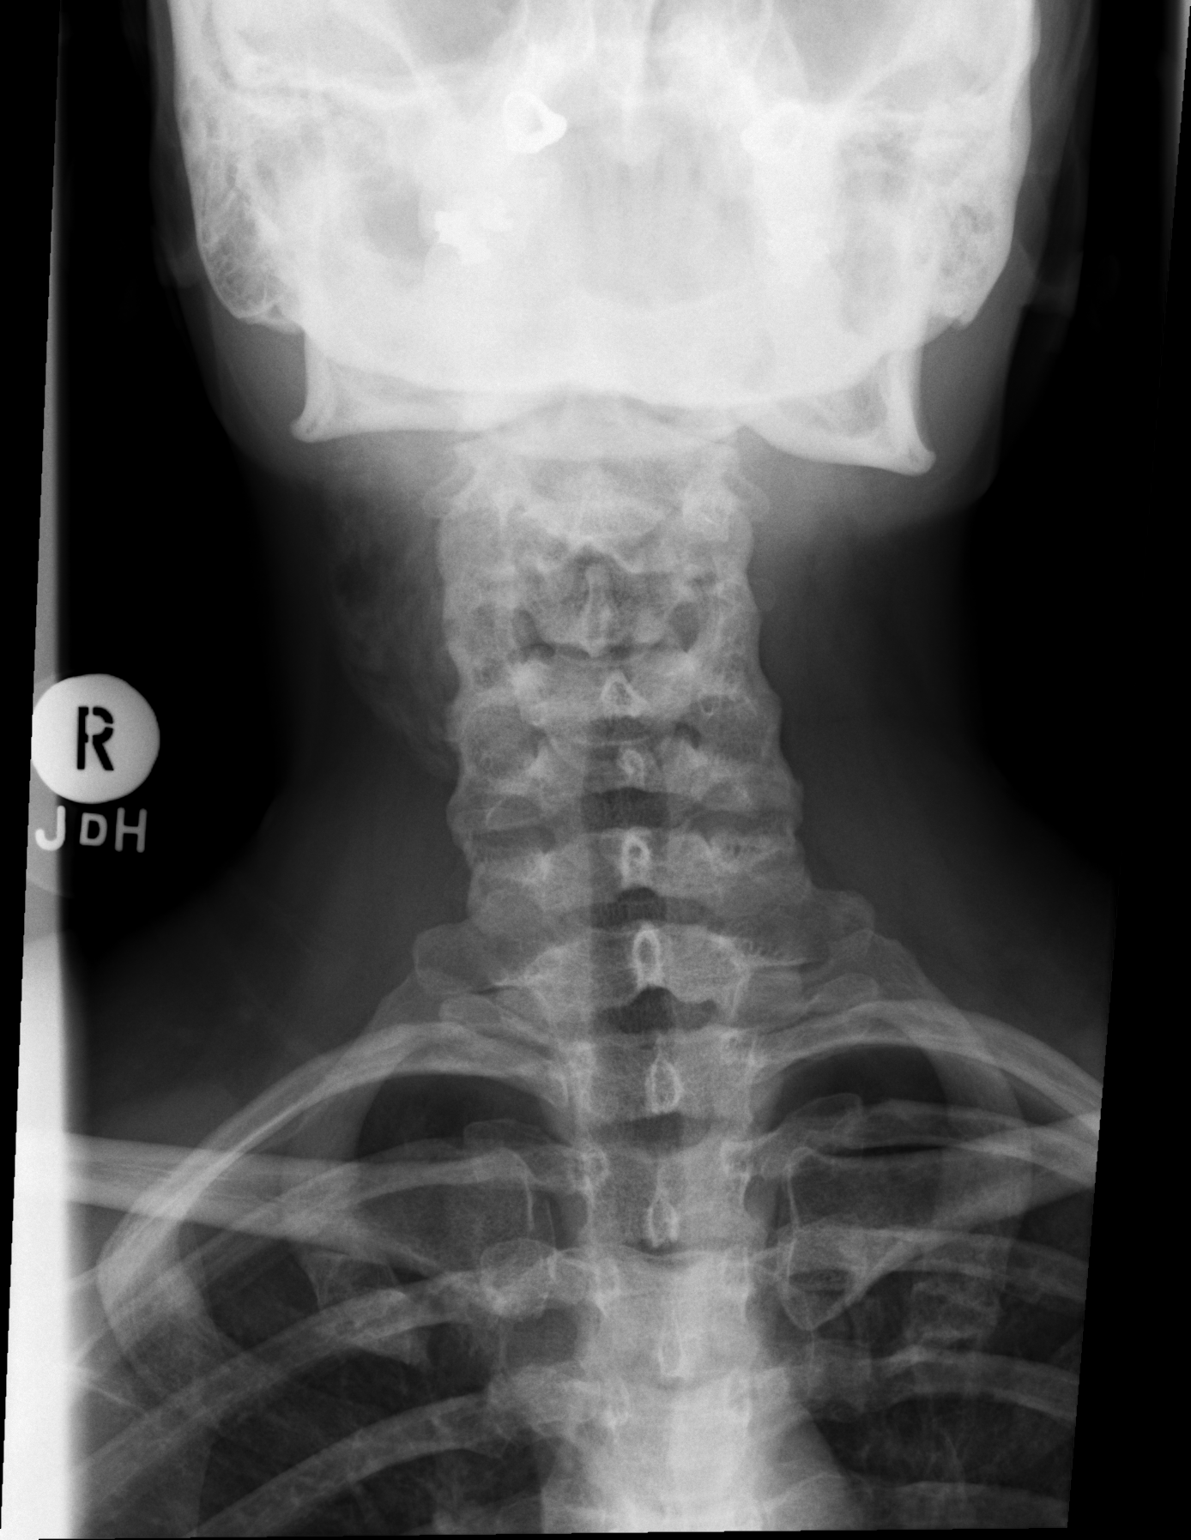

[t cervical spine odontoid (1 of 2)]
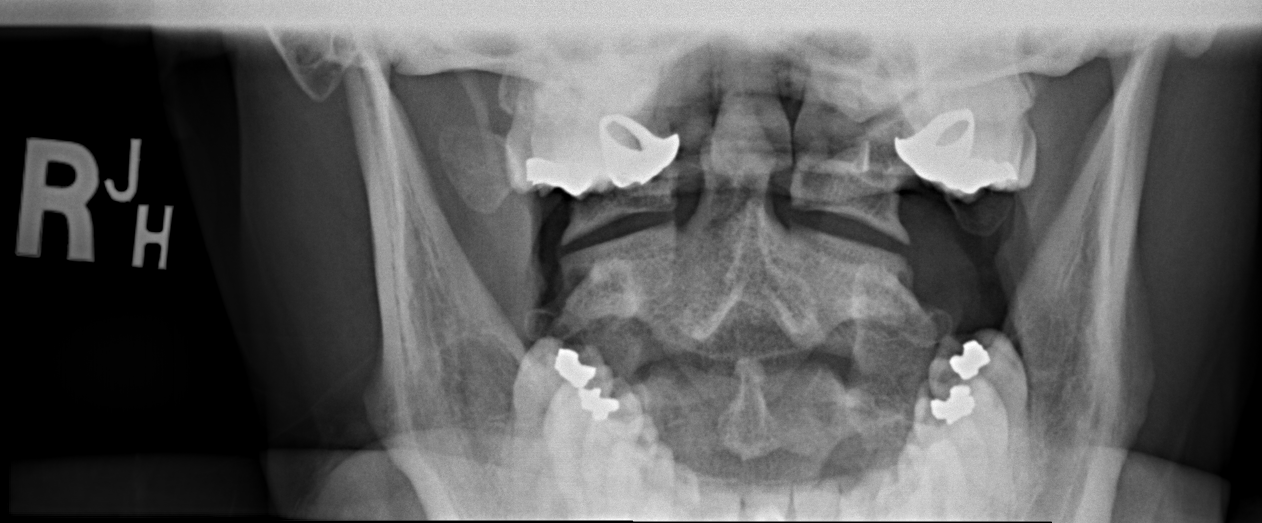

[t cervical spine odontoid (2 of 2)]
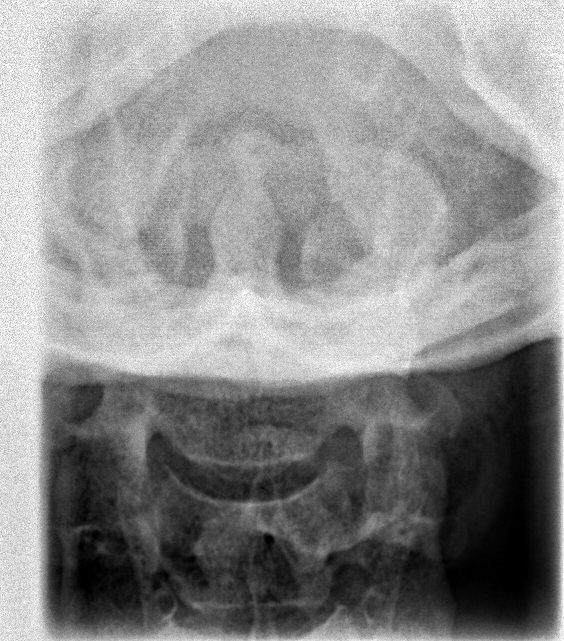

[5 of 5 positions shown; findings below may reference images not displayed]

FINDINGS: Mild degenerative change. No acute abnormality identified. No
evidence of fracture or dislocation. Pulmonary apices are clear.
IMPRESSION: Mild degenerative change.  No acute or focal abnormality identified.
